# Patient Record
Sex: Female | Born: 1955 | Race: White | Smoking: Former smoker
Health system: Southern US, Community
[De-identification: ages and names within clinical notes are randomized; demographics above are authoritative.]

## PROBLEM LIST (undated history)

## (undated) DIAGNOSIS — Z8679 Personal history of other diseases of the circulatory system: Secondary | ICD-10-CM

## (undated) DIAGNOSIS — E079 Disorder of thyroid, unspecified: Secondary | ICD-10-CM

## (undated) DIAGNOSIS — F32A Depression, unspecified: Secondary | ICD-10-CM

## (undated) DIAGNOSIS — F329 Major depressive disorder, single episode, unspecified: Secondary | ICD-10-CM

## (undated) DIAGNOSIS — I10 Essential (primary) hypertension: Secondary | ICD-10-CM

## (undated) HISTORY — DX: Depression, unspecified: F32.A

## (undated) HISTORY — PX: LUMBAR EPIDURAL INJECTION: SHX1980

## (undated) HISTORY — DX: Disorder of thyroid, unspecified: E07.9

## (undated) HISTORY — DX: Personal history of other diseases of the circulatory system: Z86.79

## (undated) HISTORY — DX: Essential (primary) hypertension: I10

## (undated) HISTORY — DX: Major depressive disorder, single episode, unspecified: F32.9

---

## 2012-03-23 ENCOUNTER — Other Ambulatory Visit: Payer: Self-pay | Admitting: Orthopedic Surgery

## 2012-03-23 DIAGNOSIS — M545 Low back pain: Secondary | ICD-10-CM

## 2012-03-29 ENCOUNTER — Ambulatory Visit
Admission: RE | Admit: 2012-03-29 | Discharge: 2012-03-29 | Disposition: A | Discharge status: Home / Self Care | Payer: 59 | Source: Ambulatory Visit | Attending: Orthopedic Surgery | Admitting: Orthopedic Surgery

## 2012-03-29 DIAGNOSIS — M545 Low back pain: Secondary | ICD-10-CM

## 2012-03-30 ENCOUNTER — Other Ambulatory Visit: Payer: Self-pay

## 2012-08-07 ENCOUNTER — Ambulatory Visit: Payer: 59

## 2012-08-07 ENCOUNTER — Ambulatory Visit (INDEPENDENT_AMBULATORY_CARE_PROVIDER_SITE_OTHER): Payer: 59 | Admitting: Emergency Medicine

## 2012-08-07 VITALS — BP 135/83 | HR 86 | Temp 98.8°F | Resp 16 | Ht 66.0 in | Wt 239.0 lb

## 2012-08-07 DIAGNOSIS — M545 Low back pain, unspecified: Secondary | ICD-10-CM | POA: Insufficient documentation

## 2012-08-07 DIAGNOSIS — E0530 Thyrotoxicosis from ectopic thyroid tissue without thyrotoxic crisis or storm: Secondary | ICD-10-CM

## 2012-08-07 DIAGNOSIS — J40 Bronchitis, not specified as acute or chronic: Secondary | ICD-10-CM

## 2012-08-07 DIAGNOSIS — R05 Cough: Secondary | ICD-10-CM

## 2012-08-07 DIAGNOSIS — Z87891 Personal history of nicotine dependence: Secondary | ICD-10-CM

## 2012-08-07 DIAGNOSIS — J029 Acute pharyngitis, unspecified: Secondary | ICD-10-CM

## 2012-08-07 DIAGNOSIS — E039 Hypothyroidism, unspecified: Secondary | ICD-10-CM

## 2012-08-07 LAB — POCT CBC
Granulocyte percent: 84.9 %G — AB (ref 37–80)
HCT, POC: 48.9 % — AB (ref 37.7–47.9)
MCH, POC: 29.4 pg (ref 27–31.2)
MCV: 93.9 fL (ref 80–97)
MID (cbc): 0.7 (ref 0–0.9)
POC LYMPH PERCENT: 10 %L (ref 10–50)
Platelet Count, POC: 356 10*3/uL (ref 142–424)
RBC: 5.21 M/uL (ref 4.04–5.48)
RDW, POC: 12.9 %
WBC: 14.4 10*3/uL — AB (ref 4.6–10.2)

## 2012-08-07 MED ORDER — LEVOFLOXACIN 500 MG PO TABS: 500.0000 mg | ORAL_TABLET | Freq: Every day | ORAL | Status: AC

## 2012-08-07 NOTE — Progress Notes (Signed)
  Subjective:    Patient ID: Crystal Garner, female    DOB: 02-Dec-1955, 56 y.o.   MRN: [id number]  Cough Associated symptoms include chills and postnasal drip. Pertinent negatives include no headaches or shortness of breath.   Patient comes into our office with complaints of Chest congestion. 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  HENT: Positive for postnasal drip.   Respiratory: Positive for cough. Negative for chest tightness and shortness of breath.   Musculoskeletal: Positive for back pain.       Due to slipped disc L4 and L5  Neurological: Negative for dizziness and headaches.       Objective:   Physical Exam HEENT exam TMs are clear nose is normal throat is slightly red. Neck is supple chest exam reveals symmetrical breath sounds there are no wheezes there are no areas of dullness to percussion cardiac exam revealed regular rate without murmurs or gallops.  Results for orders placed in visit on 08/07/12  POCT CBC      Component Value Range   WBC 14.4 (*) 4.6 - 10.2 K/uL   Lymph, poc 1.4  0.6 - 3.4   POC LYMPH PERCENT 10.0  10 - 50 %L   MID (cbc) 0.7  0 - 0.9   POC MID % 5.1  0 - 12 %M   POC Granulocyte 12.2 (*) 2 - 6.9   Granulocyte percent 84.9 (*) 37 - 80 %G   RBC 5.21  4.04 - 5.48 M/uL   Hemoglobin 15.3  12.2 - 16.2 g/dL   HCT, POC 40.9 (*) 81.1 - 47.9 %   MCV 93.9  80 - 97 fL   MCH, POC 29.4  27 - 31.2 pg   MCHC 31.3 (*) 31.8 - 35.4 g/dL   RDW, POC 91.4     Platelet Count, POC 356  142 - 424 K/uL   MPV 9.7  0 - 99.8 fL   [facility] reading (PRIMARY) by  Dr. Cleta Alberts  in markings but there are no consolidated areas no definite evidence  of pneumonia.       Assessment & Plan:  No definite pneumonia seen we'll treat with Levaquin 500 one a day she does not do well with cough medications .

## 2012-08-08 LAB — TSH: TSH: 0.57 u[IU]/mL (ref 0.350–4.500)

## 2012-08-08 MED ORDER — LEVOTHYROXINE SODIUM 150 MCG PO TABS: 150.0000 ug | ORAL_TABLET | Freq: Every day | ORAL | Status: DC

## 2012-08-08 NOTE — Addendum Note (Signed)
Addended by: Marinus Maw on: 08/08/2012 11:56 AM   Modules accepted: Orders

## 2012-12-17 ENCOUNTER — Ambulatory Visit (INDEPENDENT_AMBULATORY_CARE_PROVIDER_SITE_OTHER): Payer: 59 | Admitting: Physician Assistant

## 2012-12-17 ENCOUNTER — Telehealth: Payer: Self-pay

## 2012-12-17 VITALS — BP 136/77 | HR 58 | Temp 97.7°F | Resp 18 | Ht 65.0 in | Wt 247.0 lb

## 2012-12-17 DIAGNOSIS — H01133 Eczematous dermatitis of right eye, unspecified eyelid: Secondary | ICD-10-CM

## 2012-12-17 DIAGNOSIS — E039 Hypothyroidism, unspecified: Secondary | ICD-10-CM

## 2012-12-17 DIAGNOSIS — H01139 Eczematous dermatitis of unspecified eye, unspecified eyelid: Secondary | ICD-10-CM

## 2012-12-17 DIAGNOSIS — G43909 Migraine, unspecified, not intractable, without status migrainosus: Secondary | ICD-10-CM

## 2012-12-17 DIAGNOSIS — I10 Essential (primary) hypertension: Secondary | ICD-10-CM

## 2012-12-17 LAB — BASIC METABOLIC PANEL
BUN: 19 mg/dL (ref 6–23)
CO2: 28 mEq/L (ref 19–32)
Chloride: 100 mEq/L (ref 96–112)
Creat: 0.78 mg/dL (ref 0.50–1.10)
Glucose, Bld: 95 mg/dL (ref 70–99)
Potassium: 4.4 mEq/L (ref 3.5–5.3)

## 2012-12-17 MED ORDER — SUMATRIPTAN SUCCINATE 100 MG PO TABS: 100.0000 mg | ORAL_TABLET | ORAL | Status: DC | PRN

## 2012-12-17 MED ORDER — SUMATRIPTAN-NAPROXEN SODIUM 85-500 MG PO TABS: 1.0000 | ORAL_TABLET | ORAL | Status: DC | PRN

## 2012-12-17 MED ORDER — LISINOPRIL-HYDROCHLOROTHIAZIDE 20-12.5 MG PO TABS: ORAL_TABLET | ORAL | Status: DC

## 2012-12-17 MED ORDER — DESOXIMETASONE 0.05 % EX CREA: TOPICAL_CREAM | Freq: Two times a day (BID) | CUTANEOUS | Status: DC

## 2012-12-17 NOTE — Telephone Encounter (Signed)
Pt would like to get imitrex generic by itself possibly the 100mg  and she will get the naproxen over the counter.  The Treximet is a little expensive.  Is this possible to do this for her? She does know that the Treximet has 85mg  of imitrex in it

## 2012-12-17 NOTE — Telephone Encounter (Signed)
Pt was here recently, she is needing a change in her medication to a generic. If someone can call her back she will greatly appreciate it.

## 2012-12-17 NOTE — Progress Notes (Signed)
  Subjective:    Patient ID: Crystal Garner, female    DOB: 1956/03/06, 57 y.o.   MRN: [id number]  HPI   Crystal Garner is a 57 yr old female here for medication refills.  She recently moved from [place] to [place], has not yet established with a PCP.  Has been here one time for RF of thyroid medicine.  Now needing RFs on other medications.  Was previously a pt at [facility] in [place].  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  Also has concern for a bump on her left foot.  This has been there years.  One time it swelled up and was painful, resolved on it's own.  Not bothering her today.    Review of Systems  Constitutional: Negative for fever and chills.  HENT: Positive for congestion and rhinorrhea.   Cardiovascular: Negative.   Gastrointestinal: Negative.   Musculoskeletal: Negative.   Skin: Negative.   Neurological: Negative.        Objective:   Physical Exam  Constitutional: She is oriented to person, place, and time. She appears well-developed and well-nourished. No distress.  HENT:  Head: Normocephalic and atraumatic.  Cardiovascular: Normal rate, regular rhythm and normal heart sounds.   Pulmonary/Chest: Effort normal and breath sounds normal. She has no wheezes. She has no rales.  Musculoskeletal:       Left foot: Normal.  Neurological: She is alert and oriented to person, place, and time.  Skin: Skin is warm and dry.  Hyperpigmentation under right eye and medial aspect of eyelid  Psychiatric: She has a normal mood and affect. Her behavior is normal.     Filed Vitals:   12/17/12 1139  BP: 136/77  Pulse: 58  Temp: 97.7 F (36.5 C)  Resp:  18        Assessment & Plan:  Hypertension - Plan: Basic metabolic panel, lisinopril-hydrochlorothiazide (PRINZIDE,ZESTORETIC) 20-12.5 MG per tablet  -- BMP, refilled lisinopril/hct, will continue same dose as pt seems to have good control  Unspecified hypothyroidism - Plan: TSH  -- TSH, pt is not in need of refills at this time.  May adjust dose based on labs.  Migraine - Plan: SUMAtriptan-naproxen (TREXIMET) 85-500 MG per tablet  -- Refilled treximet  Eczematous dermatitis of eyelid, right - Plan: desoximetasone (TOPICORT) 0.05 % cream  -- Refilled topicort.  Discussed with pt my concerns with using steroid cream on the face.  Pt states she understands and would like to continue using this.  She has never been evaluated by dermatology for this.    Encouraged pt to schedule CPE sometime in the next several weeks/months to establish care and for routine health maintenance.  Pt understands and agrees.  Will follow up on labs this week and adjust meds if necessary.

## 2012-12-17 NOTE — Patient Instructions (Addendum)
I have refilled your medications today.  I will let you know when your labs are back and if we need to make any changes.  I would encourage you to schedule a Complete Physical Exam in the next several months

## 2012-12-17 NOTE — Telephone Encounter (Signed)
Sent imitrex to the pharmacy

## 2012-12-18 NOTE — Telephone Encounter (Signed)
Called patient to advise  °

## 2012-12-19 ENCOUNTER — Other Ambulatory Visit: Payer: Self-pay | Admitting: Radiology

## 2012-12-19 DIAGNOSIS — H01133 Eczematous dermatitis of right eye, unspecified eyelid: Secondary | ICD-10-CM

## 2012-12-19 MED ORDER — DESOXIMETASONE 0.05 % EX CREA: TOPICAL_CREAM | Freq: Two times a day (BID) | CUTANEOUS | Status: DC

## 2012-12-19 NOTE — Telephone Encounter (Signed)
Changed to 60 gm tube, this is available at pharmacy, but 30g tube not.

## 2012-12-26 ENCOUNTER — Ambulatory Visit (INDEPENDENT_AMBULATORY_CARE_PROVIDER_SITE_OTHER): Payer: 59 | Admitting: Emergency Medicine

## 2012-12-26 VITALS — BP 122/79 | HR 69 | Temp 97.8°F | Resp 16 | Ht 65.0 in | Wt 245.0 lb

## 2012-12-26 DIAGNOSIS — J209 Acute bronchitis, unspecified: Secondary | ICD-10-CM

## 2012-12-26 MED ORDER — PSEUDOEPHEDRINE-GUAIFENESIN ER 60-600 MG PO TB12: 1.0000 | ORAL_TABLET | Freq: Two times a day (BID) | ORAL | Status: DC

## 2012-12-26 MED ORDER — AZITHROMYCIN 250 MG PO TABS: ORAL_TABLET | ORAL | Status: DC

## 2012-12-26 MED ORDER — HYDROCOD POLST-CHLORPHEN POLST 10-8 MG/5ML PO LQCR: 5.0000 mL | Freq: Two times a day (BID) | ORAL | Status: DC | PRN

## 2012-12-26 NOTE — Patient Instructions (Signed)

## 2012-12-26 NOTE — Progress Notes (Signed)
[facility] [address] [place] [address] [phone]  Date:  12/26/2012   Name:  Crystal Garner   DOB:  1955/12/11   MRN:  [id number]  PCP:  No primary provider on file.    Chief Complaint: URI and Cough   History of Present Illness:  Aliyyah Riese is a 57 y.o. very pleasant female patient who presents with the following:  Ill for past 3-4 days with a productive cough and nasal congestion with no discharge. Has a post nasal drip. Cough is worse at night. No wheezing or shortness of breath.  No nausea or vomiting.  No stool change.  No rash.  No improvement with over the counter medications or other home remedies. Denies other complaint or health concern today.  Patient Active Problem List  Diagnosis  . LBP (low back pain)  . Hypothyroid  . Former smoker    Past Medical History  Diagnosis Date  . Depression   . Thyroid disease   . Hypertension     No past surgical history on file.  History  Substance Use Topics  . Smoking status: Former Games developer  . Smokeless tobacco: Not on file  . Alcohol Use: 0.0 oz/week    3-4 Glasses of wine per week    Family History  Problem Relation Age of Onset  . Alzheimer's disease Mother   . Diabetes Father   . Arthritis Father   . Multiple sclerosis Sister   . Heart disease Brother   . Diabetes Maternal Grandmother   . Heart disease Maternal Grandmother   . Heart disease Maternal Grandfather     Allergies  Allergen Reactions  . Shellfish Allergy Anaphylaxis and Rash  . Amoxicillin   . Codeine     Medication list has been reviewed and updated.  Current Outpatient Prescriptions on File Prior to Visit  Medication Sig Dispense Refill  . cholecalciferol (VITAMIN D) 1000 UNITS tablet Take 1,000 Units by mouth 3 (three) times a week.      . desoximetasone (TOPICORT) 0.05 % cream Apply topically 2 (two) times daily.  60 g  0  . levothyroxine (SYNTHROID, LEVOTHROID) 150 MCG tablet Take 1 tablet (150 mcg  total) by mouth daily.  30 tablet  11  . lisinopril-hydrochlorothiazide (PRINZIDE,ZESTORETIC) 20-12.5 MG per tablet Take 1/4 tablet by mouth daily  45 tablet  0  . SUMAtriptan (IMITREX) 100 MG tablet Take 1 tablet (100 mg total) by mouth every 2 (two) hours as needed for migraine.  10 tablet  0   No current facility-administered medications on file prior to visit.    Review of Systems:  As per HPI, otherwise negative.    Physical Examination: Filed Vitals:   12/26/12 1300  BP: 122/79  Pulse: 69  Temp: 97.8 F (36.6 C)  Resp: 16   Filed Vitals:   12/26/12 1300  Height: 5\' 5"  (1.651 m)  Weight: 245 lb (111.131 kg)   Body mass index is 40.77 kg/(m^2). Ideal Body Weight: Weight in (lb) to have BMI = 25: 149.9  GEN: WDWN, NAD, Non-toxic, A & O x 3 HEENT: Atraumatic, Normocephalic. Neck supple. No masses, No LAD. Ears and Nose: No external deformity. CV: RRR, No M/G/R. No JVD. No thrill. No extra heart sounds. PULM: CTA B, no wheezes, crackles, rhonchi. No retractions. No resp. distress. No accessory muscle use. ABD: S, NT, ND, +BS. No rebound. No HSM. EXTR: No c/c/e NEURO Normal gait.  PSYCH: Normally interactive. Conversant. Not depressed or  anxious appearing.  Calm demeanor.    Assessment and Plan: Bronchitis zpak tussionex mucinex d   Signed,  Phillips Odor, MD

## 2012-12-27 NOTE — Progress Notes (Signed)
Reviewed and agree.

## 2013-01-18 ENCOUNTER — Other Ambulatory Visit: Payer: Self-pay | Admitting: Physician Assistant

## 2013-06-17 ENCOUNTER — Other Ambulatory Visit: Payer: Self-pay | Admitting: Physician Assistant

## 2013-08-17 ENCOUNTER — Ambulatory Visit (INDEPENDENT_AMBULATORY_CARE_PROVIDER_SITE_OTHER): Payer: 59 | Admitting: Family Medicine

## 2013-08-17 ENCOUNTER — Encounter: Payer: Self-pay | Admitting: Family Medicine

## 2013-08-17 VITALS — BP 140/92 | HR 60 | Temp 97.8°F | Resp 16 | Ht 66.5 in | Wt 254.0 lb

## 2013-08-17 DIAGNOSIS — E039 Hypothyroidism, unspecified: Secondary | ICD-10-CM

## 2013-08-17 DIAGNOSIS — R609 Edema, unspecified: Secondary | ICD-10-CM

## 2013-08-17 DIAGNOSIS — M2550 Pain in unspecified joint: Secondary | ICD-10-CM

## 2013-08-17 DIAGNOSIS — R600 Localized edema: Secondary | ICD-10-CM

## 2013-08-17 DIAGNOSIS — M545 Low back pain: Secondary | ICD-10-CM

## 2013-08-17 DIAGNOSIS — I10 Essential (primary) hypertension: Secondary | ICD-10-CM

## 2013-08-17 LAB — CBC WITH DIFFERENTIAL/PLATELET
Basophils Relative: 1 % (ref 0–1)
HCT: 42.9 % (ref 36.0–46.0)
Hemoglobin: 14.8 g/dL (ref 12.0–15.0)
Lymphs Abs: 2.1 10*3/uL (ref 0.7–4.0)
MCHC: 34.5 g/dL (ref 30.0–36.0)
Monocytes Absolute: 0.7 10*3/uL (ref 0.1–1.0)
Monocytes Relative: 8 % (ref 3–12)
Neutro Abs: 5.3 10*3/uL (ref 1.7–7.7)
Neutrophils Relative %: 63 % (ref 43–77)
RBC: 5.04 MIL/uL (ref 3.87–5.11)

## 2013-08-17 LAB — COMPREHENSIVE METABOLIC PANEL
ALT: 16 U/L (ref 0–35)
Albumin: 4.4 g/dL (ref 3.5–5.2)
Alkaline Phosphatase: 60 U/L (ref 39–117)
CO2: 25 mEq/L (ref 19–32)
Creat: 0.7 mg/dL (ref 0.50–1.10)
Glucose, Bld: 89 mg/dL (ref 70–99)
Potassium: 4.1 mEq/L (ref 3.5–5.3)
Sodium: 138 mEq/L (ref 135–145)
Total Bilirubin: 0.6 mg/dL (ref 0.3–1.2)
Total Protein: 7.4 g/dL (ref 6.0–8.3)

## 2013-08-17 LAB — LIPID PANEL
Cholesterol: 286 mg/dL — ABNORMAL HIGH (ref 0–200)
HDL: 62 mg/dL (ref >39)
LDL Cholesterol: 171 mg/dL — ABNORMAL HIGH (ref 0–99)
Total CHOL/HDL Ratio: 4.6 Ratio
Triglycerides: 266 mg/dL — ABNORMAL HIGH (ref <150)
VLDL: 53 mg/dL — ABNORMAL HIGH (ref 0–40)

## 2013-08-17 LAB — TSH: TSH: 1.806 u[IU]/mL (ref 0.350–4.500)

## 2013-08-17 MED ORDER — SUMATRIPTAN SUCCINATE 100 MG PO TABS: ORAL_TABLET | ORAL | Status: DC

## 2013-08-17 MED ORDER — HYDROCHLOROTHIAZIDE 25 MG PO TABS: 25.0000 mg | ORAL_TABLET | Freq: Every day | ORAL | Status: DC

## 2013-08-17 NOTE — Progress Notes (Addendum)
Subjective:    Patient ID: Crystal Garner, female    DOB: Mar 07, 1956, 57 y.o.   MRN: [id number] Chief Complaint  Patient presents with  . Medication Refill    all meds  . Foot Swelling    HPI  Taking vitamin D 1000u every other day.  Does have a home BP machine but has not used in a while.  Has had several presyncopal episodes w/ hypotension which is why she has it but no episodes in over a year. Unknown etiology of prior episodic hypotension.   Has been on 1/4 tab of her BP medication for many years - was initially started for migraine prophylaxis at [facility] and never changed.  Is not fasting today - has not eaten at all but had cream in her coffee. Does not want to start a statin no matter what.  Has been seeing orthopedist at [facility] for the past year - Dr. Raeanne Barry - and just finished 6 wks of PT. Is less active due to back pain. Due to back pain she sits a lot.  Has had episodes over the years of a single joint being very painful for sev wks and then will go away - seems to be migratory. Had neg arthritis panel but has never been evaluated during an episode of joint pain.  Has gained a lot of weight in the past year. Knows she needs to loose it but difficult  Migraines infrequent but still happen occasionally.   For that past sev mos her feet and ankles will swell up intermittently. Has had left chest tightness and squeezing sensation in the past - not in quite a while.  No ShoB, DOE, orthopnea, or PND. Father had CHF so she was worried about that.  Feels like father did much worse on his medication - thinks the medication is horrible and that it caused the end of his life to be miserable - that is why she never wants to be on a statin. Wants to minimize medications and medical interventions if able.  Is a vegetarian - tries to do a lot of tofu, beans, peas, nuts for protein sources. Occ fish but shellfish allergy.  Sugar makes her gain a lot of weight so tries to stay away  from fruit.  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 Does not want to get today.  Allergic to the adhesive used to do EKGs.  Just figured it out last year. After an EKG would have weeks of horrible itching. The hospital told her there was an alternative way to have the performed other than w/ the adhesive.   Review of Systems  Constitutional: Positive for activity change, fatigue and unexpected weight change. Negative for fever, chills, diaphoresis and appetite change.  Eyes: Negative for visual disturbance.  Respiratory: Positive for chest tightness. Negative for cough and shortness of breath.   Cardiovascular: Positive for leg swelling. Negative for chest pain and palpitations.  Genitourinary: Positive for difficulty urinating. Negative for dysuria, frequency and decreased urine volume.  Neurological: Negative for dizziness, syncope, light-headedness and headaches.  Hematological: Does not bruise/bleed easily.      BP 140/92  Pulse 60  Temp(Src) 97.8 F (36.6 C) (Oral)  Resp 16  Ht 5' 6.5" (1.689 m)  Wt 254 lb (115.214 kg)  BMI 40.39 kg/m2  SpO2 98% Objective:   Physical Exam  Constitutional: She is oriented to person, place, and time. She appears well-developed and well-nourished. No distress.  HENT:  Head: Normocephalic and atraumatic.  Right Ear: External ear normal.  Left Ear: External ear normal.  Eyes: Conjunctivae are normal. No scleral icterus.  Neck: Normal range of motion. Neck supple. No thyromegaly present.  Cardiovascular: Normal rate, regular rhythm, normal heart sounds and intact distal pulses.   2+ bilaterally pitting edema to mid-shin  Pulmonary/Chest: Effort normal and breath sounds normal. No respiratory distress.  Musculoskeletal: She exhibits edema.  Lymphadenopathy:    She has no cervical adenopathy.  Neurological: She is alert and oriented to person, place, and time.  Skin: Skin is warm and dry. She is not diaphoretic.  No erythema.  Psychiatric: She has a normal mood and affect. Her behavior is normal.  Pt tearful when talking about father and how horrible the medications for CHF made him feel.       EKG: pt refused. Assessment & Plan:  Hypothyroid - Plan: TSH - Refill levothyroxine after tsh comes back.  TSH nml so refilled x 1 yr.  LBP (low back pain) - followed by Dr. Raeanne Barry at [facility], completed PT.  Arthralgia - Plan: Vit D  25 hydroxy (rtn osteoporosis monitoring) - on otc 1000u qod.  Essential hypertension, benign - Plan: CBC with Differential, Lipid panel, Comprehensive metabolic panel - pt was on 1/4 tab lisinopril-hctz 20/12.5 (so on 5/3.125). Since she is having more pitting dependent lower ext edema will stop this med and just start on hctz 25 qd. Discussed high potassium diet (pt doesn't like to eat fruit though as it is just sugar which makes her fat).  Pedal edema - attributed to weight gain and sedentary life style (due to back and joint pains). No concern for CHF at this point as does go away at times and no other sxs. However, pt has had some episodes of left-sided chest tightness and has a +FHx of CAD/CHF so encouraged pt to get baseline EKG and cardiac eval for exercise stress test - pt will consider - will try to get EKG done somewhere where they don't use adhesive.  HM - pt recommended to get TDaP. Rec f/u OV for CPE.  Migraines - If recur when changing BP medication, can always add back in acei (though unusual for migraine prophylaxis so do not expect this.)  Meds ordered this encounter  Medications  . SUMAtriptan (IMITREX) 100 MG tablet    Sig: TAKE ONE TABLET BY MOUTH EVERY 2 HOURS AS NEEDED FOR MIGRAINE    Dispense:  10 tablet    Refill:  11  . hydrochlorothiazide (HYDRODIURIL) 25 MG tablet    Sig: Take 1 tablet (25 mg total) by mouth daily.    Dispense:  90 tablet    Refill:  1   Over 40 minutes spent in face-to-face eval of pt.  Norberto Sorenson, MD MPH

## 2013-08-17 NOTE — Patient Instructions (Addendum)
Start checking your blood pressure at home. You want your blood pressure to be in the 110s-120s/70-80.  If occasionally in the 130s that is ok but if it is up to 140s that is to high and we would want to increase your blood pressure medication.  If you start getting more migraines we may want to switch you back or try a different BP medication. I would recommend getting a stress exercise test since you have had some chest pain in the past and you have the risk factors of family history, age, elevated blood pressure, and weight.  If you would like a referral to a cardiologist for this let me know.  I would also recommend getting a baseline EKG into the system. If you ever have an episode of chest pain we need a baseline - when you are healthy and pain free - to compare your EKG during the chest pain too so that we can tell if the pain is coming from your heart. You may call the hospital or the cardiology offices in town to see if they can do an EKG w/ a different adhesive - we are all on the same computer system so would have access to this. You are due for a tetanus shot - plan to get this at your next OV. DASH Diet The DASH diet stands for "Dietary Approaches to Stop Hypertension." It is a healthy eating plan that has been shown to reduce high blood pressure (hypertension) in as little as 14 days, while also possibly providing other significant health benefits. These other health benefits include reducing the risk of breast cancer after menopause and reducing the risk of type 2 diabetes, heart disease, colon cancer, and stroke. Health benefits also include weight loss and slowing kidney failure in patients with chronic kidney disease.  DIET GUIDELINES  Limit salt (sodium). Your diet should contain less than 1500 mg of sodium daily.  Limit refined or processed carbohydrates. Your diet should include mostly whole grains. Desserts and added sugars should be used sparingly.  Include small amounts of  heart-healthy fats. These types of fats include nuts, oils, and tub margarine. Limit saturated and trans fats. These fats have been shown to be harmful in the body. CHOOSING FOODS  The following food groups are based on a 2000 calorie diet. See your Registered Dietitian for individual calorie needs. Grains and Grain Products (6 to 8 servings daily)  Eat More Often: Whole-wheat bread, brown rice, whole-grain or wheat pasta, quinoa, popcorn without added fat or salt (air popped).  Eat Less Often: White bread, white pasta, white rice, cornbread. Vegetables (4 to 5 servings daily)  Eat More Often: Fresh, frozen, and canned vegetables. Vegetables may be raw, steamed, roasted, or grilled with a minimal amount of fat.  Eat Less Often/Avoid: Creamed or fried vegetables. Vegetables in a cheese sauce. Fruit (4 to 5 servings daily)  Eat More Often: All fresh, canned (in natural juice), or frozen fruits. Dried fruits without added sugar. One hundred percent fruit juice ( cup [237 mL] daily).  Eat Less Often: Dried fruits with added sugar. Canned fruit in light or heavy syrup. [employer], Fish, and Poultry (2 servings or less daily. One serving is 3 to 4 oz [85-114 g]).  Eat More Often: Ninety percent or leaner ground beef, tenderloin, sirloin. Round cuts of beef, chicken breast, [place] breast. All fish. Grill, bake, or broil your meat. Nothing should be fried.  Eat Less Often/Avoid: Fatty cuts of meat, [place], or chicken  leg, thigh, or wing. Fried cuts of meat or fish. Dairy (2 to 3 servings)  Eat More Often: Low-fat or fat-free milk, low-fat plain or light yogurt, reduced-fat or part-skim cheese.  Eat Less Often/Avoid: Milk (whole, 2%).Whole milk yogurt. Full-fat cheeses. Nuts, Seeds, and Legumes (4 to 5 servings per week)  Eat More Often: All without added salt.  Eat Less Often/Avoid: Salted nuts and seeds, canned beans with added salt. Fats and Sweets (limited)  Eat More Often: Vegetable  oils, tub margarines without trans fats, sugar-free gelatin. Mayonnaise and salad dressings.  Eat Less Often/Avoid: Coconut oils, palm oils, butter, stick margarine, cream, half and half, cookies, candy, pie. FOR MORE INFORMATION The Dash Diet Eating Plan: www.dashdiet.org Document Released: 08/19/2011 Document Revised: 11/22/2011 Document Reviewed: 08/19/2011 [facility] Patient Information 2014 [place], [place]. Potassium Content of Foods Potassium is a mineral found in many foods and drinks. It helps keep fluids and minerals balanced in your body and also affects how steadily your heart beats. The body needs potassium to control blood pressure and to keep the muscles and nervous system healthy. However, certain health conditions and medicine may require you to eat more or less potassium-rich foods and drinks. Your caregiver or dietitian will tell you how much potassium you should have each day. COMMON SERVING SIZES The list below tells you how big or small common portion sizes are:  1 oz.........4 stacked dice.  3 oz........Marland KitchenDeck of cards.  1 tsp.......Marland KitchenTip of little finger.  1 tbsp....Marland KitchenMarland KitchenThumb.  2 tbsp....Marland KitchenMarland KitchenGolf ball.   c..........Marland KitchenHalf of a fist.  1 c...........Marland KitchenA fist. FOODS AND DRINKS HIGH IN POTASSIUM More than 200 mg of potassium per serving. A serving size is  c (120 mL or noted gram weight) unless otherwise stated. While all the items on this list are high in potassium, some items are higher in potassium than others. Fruits  Apricots (sliced), 83 g.  Apricots (dried halves), 3 oz / 24 g.  Avocado (cubed),  c / 50 g.  Banana (sliced), 75 g.  Cantaloupe (cubed), 80 g.  Dates (pitted), 5 whole / 35 g.  Figs (dried), 4 whole / 32 g.  Guava, c / 55 g.  Honeydew, 1 wedge / 85 g.  Kiwi (sliced), 90 g.  Nectarine, 1 small / 129 g.  Orange, 1 medium / 131 g.  Orange juice.  Pomegranate seeds, 87 g.  Pomegranate juice.  Prunes (pitted), 3 whole / 30  g.  Prune juice, 3 oz / 90 mL.  Seedless raisins, 3 tbsp / 27 g. Vegetables  Artichoke,  of a medium / 64 g.  Asparagus (boiled), 90 g.  Baked beans,  c / 63 g.  Bamboo shoots,  c / 38 g.  Beets (cooked slices), 85 g.  Broccoli (boiled), 78 g.  Brussels sprout (boiled), 78 g.  Butternut squash (baked), 103 g.  Chickpea (cooked), 82 g.  Green peas (cooked), 80 g.  Hubbard squash (baked cubes),  c / 68 g.  Kidney beans (cooked), 5 tbsp / 55 g.  Lima beans (cooked),  c / 43 g.  Navy beans (cooked),  c / 61 g.  Potato (baked), 61 g.  Potato (boiled), 78 g.  Pumpkin (boiled), 123 g.  Refried beans,  c / 79 g.  Spinach (cooked),  c / 45 g.  Split peas (cooked),  c / 65 g.  Sun-dried tomatoes, 2 tbsp / 7 g.  Sweet potato (baked),  c / 50 g.  Tomato (chopped or sliced), 90 g.  Tomato juice.  Tomato paste, 4 tsp / 21 g.  Tomato sauce,  c / 61 g.  Vegetable juice.  White mushrooms (cooked), 78 g.  Yam (cooked or baked),  c / 34 g.  Zucchini squash (boiled), 90 g. Other Foods and Drinks  Almonds (whole),  c / 36 g.  Cashews (oil roasted),  c / 32 g.  Chocolate milk.  Chocolate pudding, 142 g.  Clams (steamed), 1.5 oz / 43 g.  Dark chocolate, 1.5 oz / 42 g.  Fish, 3 oz / 85 g.  King crab (steamed), 3 oz / 85 g.  Lobster (steamed), 4 oz / 113 g.  Milk (skim, 1%, 2%, whole), 1 c / 240 mL.  Milk chocolate, 2.3 oz / 66 g.  Milk shake.  Nonfat fruit variety yogurt, 123 g.  Peanuts (oil roasted), 1 oz / 28 g.  Peanut butter, 2 tbsp / 32 g.  Pistachio nuts, 1 oz / 28 g.  Pumpkin seeds, 1 oz / 28 g.  Red meat (broiled, cooked, grilled), 3 oz / 85 g.  Scallops (steamed), 3 oz / 85 g.  Shredded wheat cereal (dry), 3 oblong biscuits / 75 g.  Spaghetti sauce,  c / 66 g.  Sunflower seeds (dry roasted), 1 oz / 28 g.  Veggie burger, 1 patty / 70 g. FOODS MODERATE IN POTASSIUM Between 150 mg and 200 mg per serving. A  serving is  c (120 mL or noted gram weight) unless otherwise stated. Fruits  Grapefruit,  of the fruit / 123 g.  Grapefruit juice.  Pineapple juice.  Plums (sliced), 83 g.  Tangerine, 1 large / 120 g. Vegetables  Carrots (boiled), 78 g.  Carrots (sliced), 61 g.  Rhubarb (cooked with sugar), 120 g.  [place] (cooked), 120 g.  Sweet corn (cooked), 75 g.  Yellow snap beans (cooked), 63 g. Other Foods and Drinks   Bagel, 1 bagel / 98 g.  Chicken breast (roasted and chopped),  c / 70 g.  Chocolate ice cream / 66 g.  Pita bread, 1 large / 64 g.  Shrimp (steamed), 4 oz / 113 g.  Swiss cheese (diced), 70 g.  Vanilla ice cream, 66 g.  Vanilla pudding, 140 g. FOODS LOW IN POTASSIUM Less than 150 mg per serving. A serving size is  cup (120 mL or noted gram weight) unless otherwise stated. If you eat more than 1 serving of a food low in potassium, the food may be considered a food high in potassium. Fruits  Apple (slices), 55 g.  Apple juice.  Applesauce, 122 g.  Blackberries, 72 g.  Blueberries, 74 g.  Cranberries, 50 g.  Cranberry juice.  Fruit cocktail, 119 g.  Fruit punch.  Grapes, 46 g.  Grape juice.  Mandarin oranges (canned), 126 g.  Peach (slices), 77 g.  Pineapple (chunks), 83 g.  Raspberries, 62 g.  Red cherries (without pits), 78 g.  Strawberries (sliced), 83 g.  Watermelon (diced), 76 g. Vegetables  Alfalfa sprouts, 17 g.  Bell peppers (sliced), 46 g.  Cabbage (shredded), 35 g.  Cauliflower (boiled), 62 g.  Celery, 51 g.  Collard greens (boiled), 95 g.  Cucumber (sliced), 52 g.  Eggplant (cubed), 41 g.  Green beans (boiled), 63 g.  Lettuce (shredded), 1 c / 36 g.  Onions (sauteed), 44 g.  Radishes (sliced), 58 g.  Spaghetti squash, 51 g. Other Foods and Drinks  [employer], 1 slice / 28 g.  Black tea.  Brown rice (cooked), 98 g.  Butter croissant, 1 medium / 57 g.  Carbonated  soda.  Coffee.  Cheddar cheese (diced), 66 g.  Corn flake cereal (dry), 14 g.  Cottage cheese, 118 g.  Cream of rice cereal (cooked), 122 g.  Cream of wheat cereal (cooked), 126 g.  Crisped rice cereal (dry), 14 g.  Egg (boiled, fried, poached, omelet, scrambled), 1 large / 46 61 g.  English muffin, 1 muffin / 57 g.  Frozen ice pop, 1 pop / 55 g.  Graham cracker, 1 large rectangular cracker / 14 g.  Jelly beans, 112 g.  Non-dairy whipped topping.  Oatmeal, 88 g.  Orange sherbet, 74 g.  Puffed rice cereal (dry), 7 g.  Pasta (cooked), 70 g.  Rice cakes, 4 cakes / 36 g.  Sugared doughnut, 4 oz / 116 g.  White bread, 1 slice / 30 g.  White rice (cooked), 79 93 g.  Wild rice (cooked), 82 g.  Yellow cake, 1 slice / 68 g. Document Released: 04/13/2005 Document Revised: 08/16/2012 Document Reviewed: 01/14/2012 [facility] Patient Information 2014 [place], [place]. Peripheral Edema You have swelling in your legs (peripheral edema). This swelling is due to excess accumulation of salt and water in your body. Edema may be a sign of heart, kidney or liver disease, or a side effect of a medication. It may also be due to problems in the leg veins. Elevating your legs and using special support stockings may be very helpful, if the cause of the swelling is due to poor venous circulation. Avoid long periods of standing, whatever the cause. Treatment of edema depends on identifying the cause. Chips, pretzels, pickles and other salty foods should be avoided. Restricting salt in your diet is almost always needed. Water pills (diuretics) are often used to remove the excess salt and water from your body via urine. These medicines prevent the kidney from reabsorbing sodium. This increases urine flow. Diuretic treatment may also result in lowering of potassium levels in your body. Potassium supplements may be needed if you have to use diuretics daily. Daily weights can help you keep track of  your progress in clearing your edema. You should call your caregiver for follow up care as recommended. SEEK IMMEDIATE MEDICAL CARE IF:   You have increased swelling, pain, redness, or heat in your legs.  You develop shortness of breath, especially when lying down.  You develop chest or abdominal pain, weakness, or fainting.  You have a fever. Document Released: 10/07/2004 Document Revised: 11/22/2011 Document Reviewed: 09/17/2009 [facility] Patient Information 2014 [place], [place]. Edema Edema is an abnormal build-up of fluids in tissues. Because this is partly dependent on gravity (water flows to the lowest place), it is more common in the legs and thighs (lower extremities). It is also common in the looser tissues, like around the eyes. Painless swelling of the feet and ankles is common and increases as a person ages. It may affect both legs and may include the calves or even thighs. When squeezed, the fluid may move out of the affected area and may leave a dent for a few moments. CAUSES   Prolonged standing or sitting in one place for extended periods of time. Movement helps pump tissue fluid into the veins, and absence of movement prevents this, resulting in edema.  Varicose veins. The valves in the veins do not work as well as they should. This causes fluid to leak into the tissues.  Fluid and salt overload.  Injury, burn, or  surgery to the leg, ankle, or foot, may damage veins and allow fluid to leak out.  Sunburn damages vessels. Leaky vessels allow fluid to go out into the sunburned tissues.  Allergies (from insect bites or stings, medications or chemicals) cause swelling by allowing vessels to become leaky.  Protein in the blood helps keep fluid in your vessels. Low protein, as in malnutrition, allows fluid to leak out.  Hormonal changes, including pregnancy and menstruation, cause fluid retention. This fluid may leak out of vessels and cause edema.  Medications that cause  fluid retention. Examples are sex hormones, blood pressure medications, steroid treatment, or anti-depressants.  Some illnesses cause edema, especially heart failure, kidney disease, or liver disease.  Surgery that cuts veins or lymph nodes, such as surgery done for the heart or for breast cancer, may result in edema. DIAGNOSIS  Your caregiver is usually easily able to determine what is causing your swelling (edema) by simply asking what is wrong (getting a history) and examining you (doing a physical). Sometimes x-rays, EKG (electrocardiogram or heart tracing), and blood work may be done to evaluate for underlying medical illness. TREATMENT  General treatment includes:  Leg elevation (or elevation of the affected body part).  Restriction of fluid intake.  Prevention of fluid overload.  Compression of the affected body part. Compression with elastic bandages or support stockings squeezes the tissues, preventing fluid from entering and forcing it back into the blood vessels.  Diuretics (also called water pills or fluid pills) pull fluid out of your body in the form of increased urination. These are effective in reducing the swelling, but can have side effects and must be used only under your caregiver's supervision. Diuretics are appropriate only for some types of edema. The specific treatment can be directed at any underlying causes discovered. Heart, liver, or kidney disease should be treated appropriately. HOME CARE INSTRUCTIONS   Elevate the legs (or affected body part) above the level of the heart, while lying down.  Avoid sitting or standing still for prolonged periods of time.  Avoid putting anything directly under the knees when lying down, and do not wear constricting clothing or garters on the upper legs.  Exercising the legs causes the fluid to work back into the veins and lymphatic channels. This may help the swelling go down.  The pressure applied by elastic bandages or  support stockings can help reduce ankle swelling.  A low-salt diet may help reduce fluid retention and decrease the ankle swelling.  Take any medications exactly as prescribed. SEEK MEDICAL CARE IF:  Your edema is not responding to recommended treatments. SEEK IMMEDIATE MEDICAL CARE IF:   You develop shortness of breath or chest pain.  You cannot breathe when you lay down; or if, while lying down, you have to get up and go to the window to get your breath.  You are having increasing swelling without relief from treatment.  You develop a fever over 102 F (38.9 C).  You develop pain or redness in the areas that are swollen.  Tell your caregiver right away if you have gained 03 lb/1.4 kg in 1 day or 05 lb/2.3 kg in a week. MAKE SURE YOU:   Understand these instructions.  Will watch your condition.  Will get help right away if you are not doing well or get worse. Document Released: 08/30/2005 Document Revised: 02/29/2012 Document Reviewed: 04/17/2008 [facility] Patient Information 2014 [place], [place].

## 2013-08-30 IMAGING — CR DG CHEST 2V
2 series · 2 of 2 positions shown · non-contrast
Comparison: 03/25/2009.

CLINICAL DATA: Shortness of breath.

CHEST - 2 VIEW

[PA]
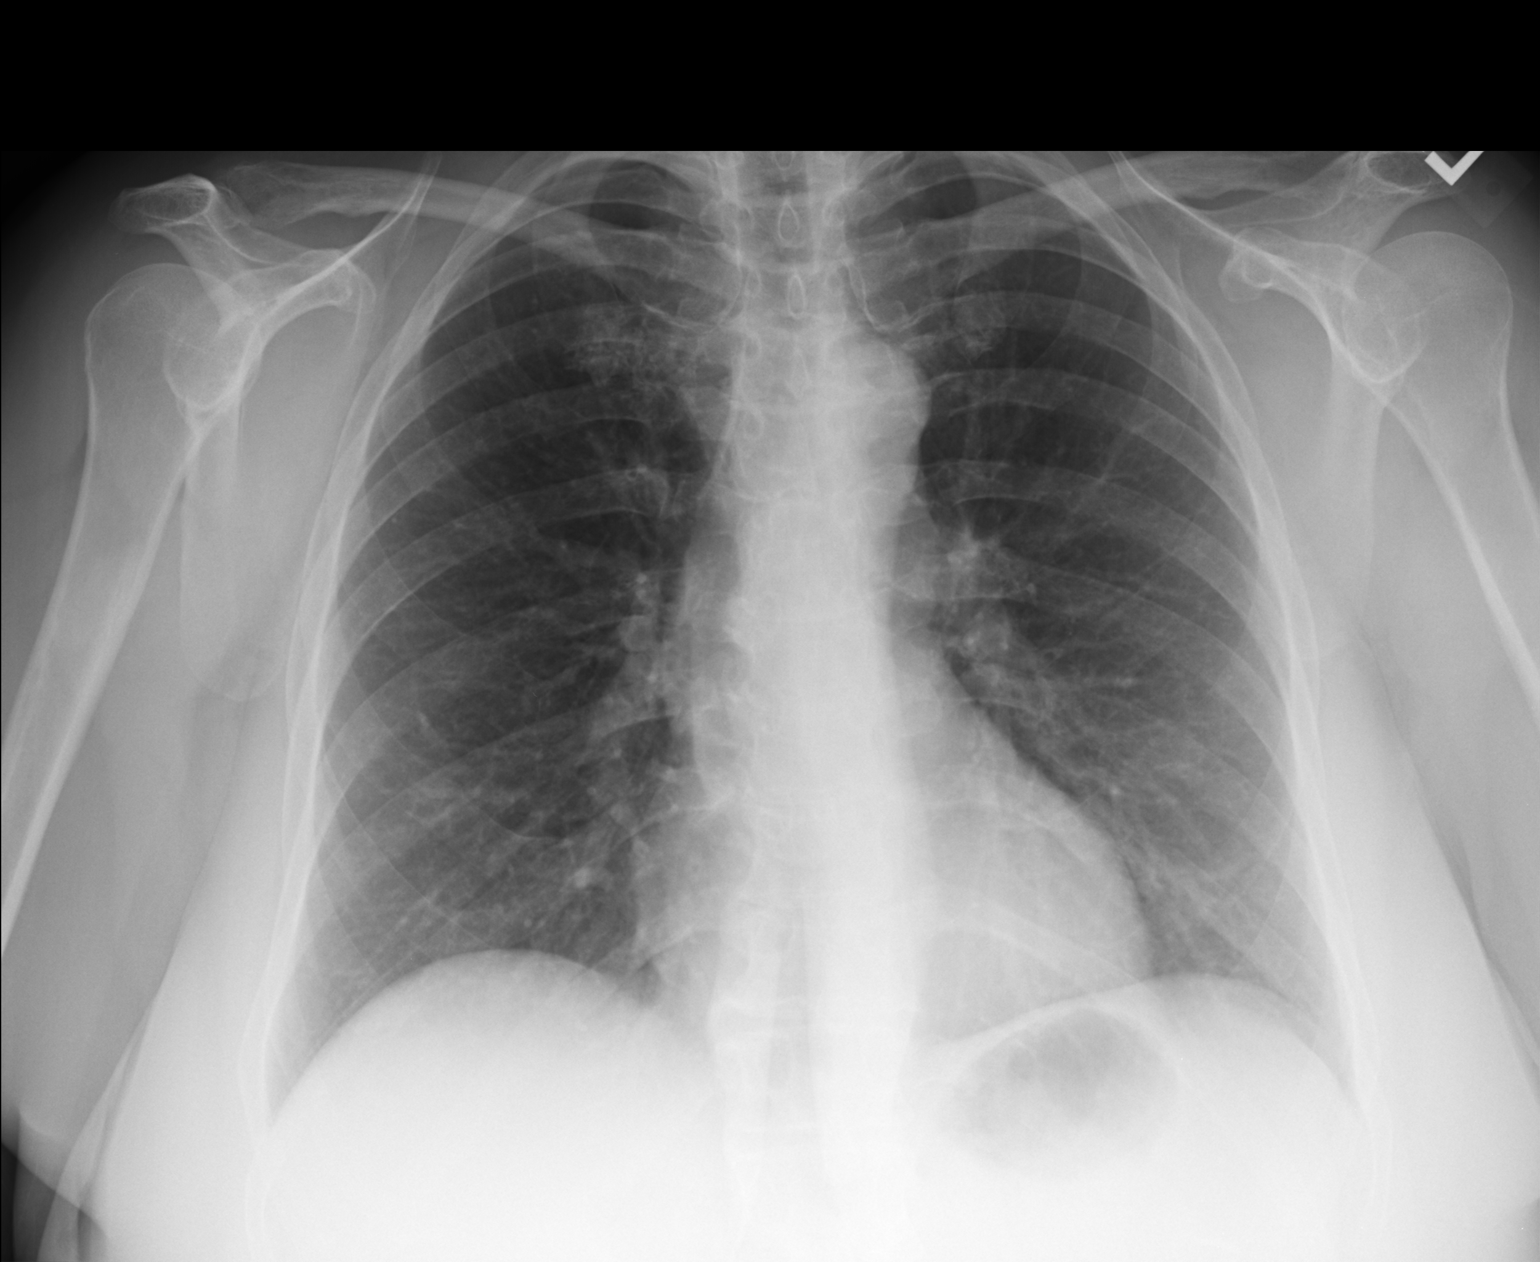

[lateral]
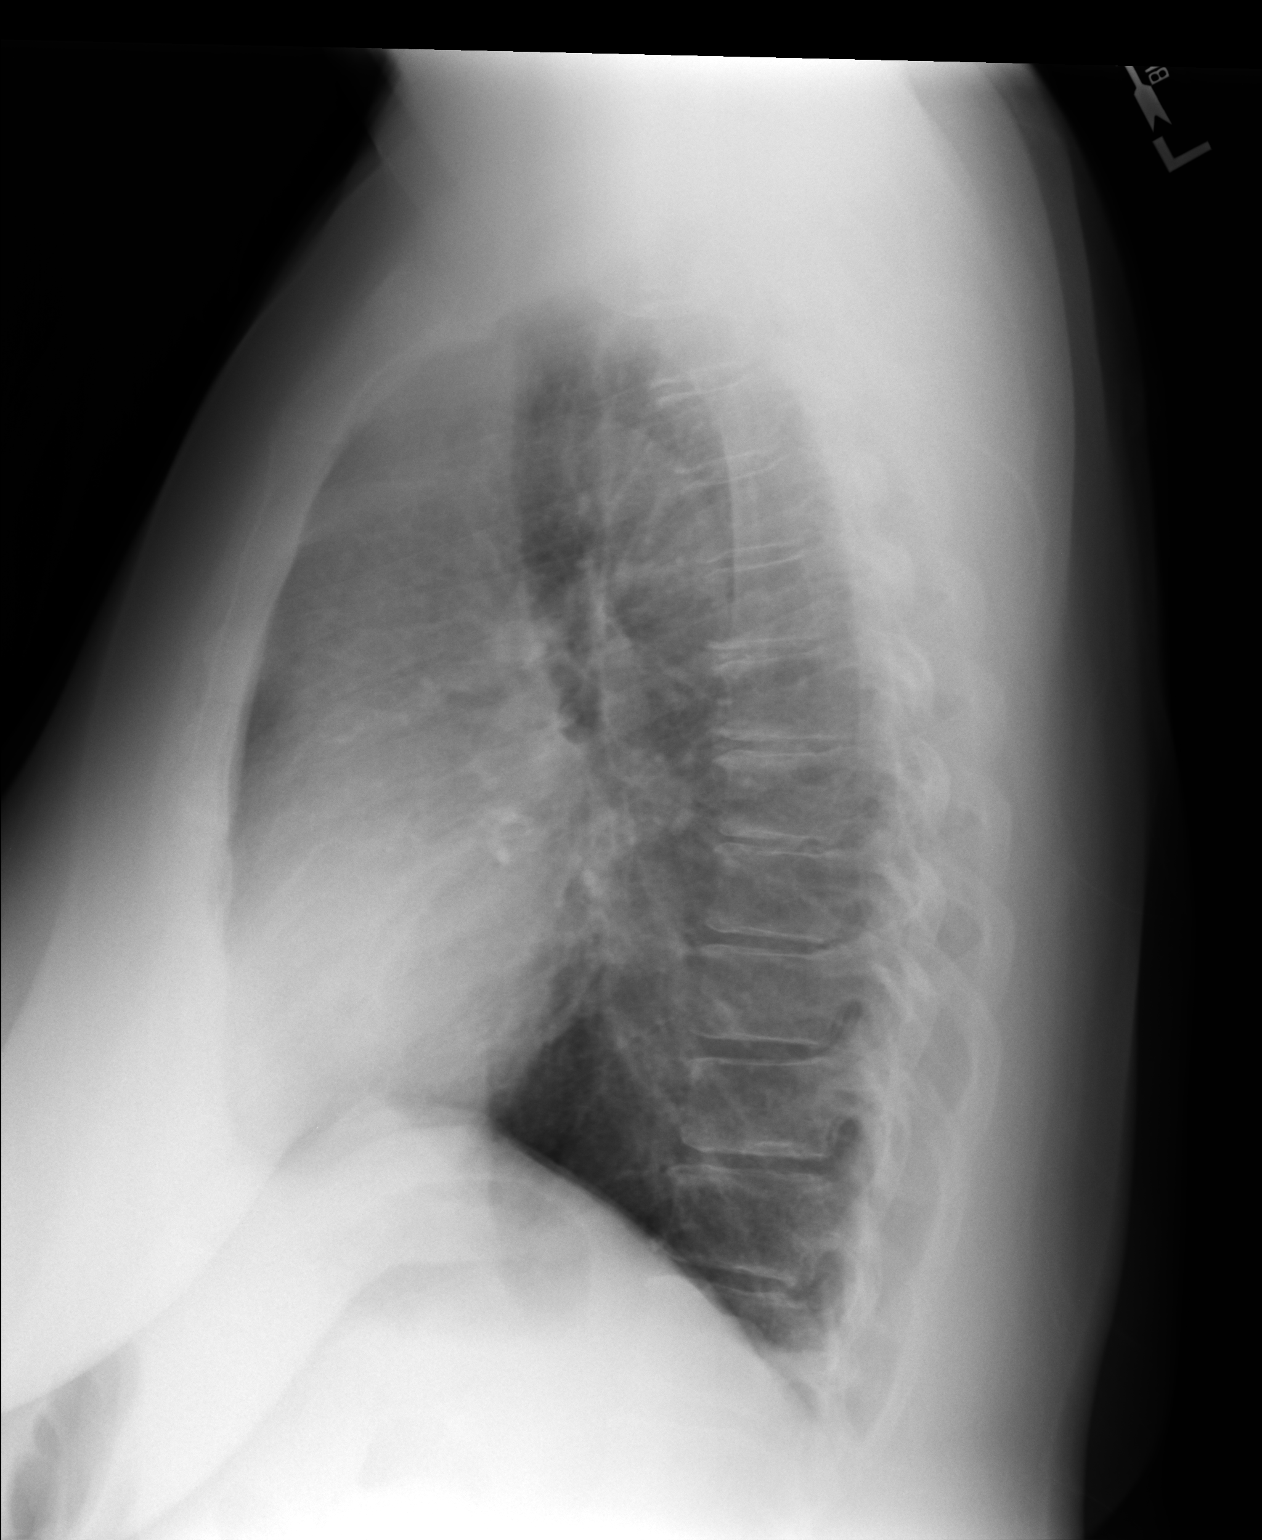

[2 of 2 positions shown; findings below may reference images not displayed]

FINDINGS: Trachea is midline.  Heart size normal.  The lungs are
clear.  No pleural fluid.
IMPRESSION: No acute findings.

## 2013-08-31 ENCOUNTER — Other Ambulatory Visit: Payer: Self-pay | Admitting: Emergency Medicine

## 2013-10-13 ENCOUNTER — Encounter: Payer: Self-pay | Admitting: Family Medicine

## 2014-01-11 ENCOUNTER — Encounter: Payer: Self-pay | Admitting: Family Medicine

## 2014-01-11 ENCOUNTER — Ambulatory Visit (INDEPENDENT_AMBULATORY_CARE_PROVIDER_SITE_OTHER): Payer: 59 | Admitting: Family Medicine

## 2014-01-11 VITALS — BP 140/75 | HR 68 | Temp 97.0°F | Resp 16 | Ht 66.0 in | Wt 257.0 lb

## 2014-01-11 DIAGNOSIS — I10 Essential (primary) hypertension: Secondary | ICD-10-CM

## 2014-01-11 DIAGNOSIS — M545 Low back pain, unspecified: Secondary | ICD-10-CM

## 2014-01-11 DIAGNOSIS — E673 Hypervitaminosis D: Secondary | ICD-10-CM

## 2014-01-11 DIAGNOSIS — L219 Seborrheic dermatitis, unspecified: Secondary | ICD-10-CM

## 2014-01-11 DIAGNOSIS — E785 Hyperlipidemia, unspecified: Secondary | ICD-10-CM

## 2014-01-11 DIAGNOSIS — Z79899 Other long term (current) drug therapy: Secondary | ICD-10-CM

## 2014-01-11 DIAGNOSIS — G43909 Migraine, unspecified, not intractable, without status migrainosus: Secondary | ICD-10-CM

## 2014-01-11 LAB — COMPREHENSIVE METABOLIC PANEL
ALBUMIN: 4.2 g/dL (ref 3.5–5.2)
ALT: 19 U/L (ref 0–35)
AST: 19 U/L (ref 0–37)
Alkaline Phosphatase: 58 U/L (ref 39–117)
BUN: 17 mg/dL (ref 6–23)
CHLORIDE: 101 meq/L (ref 96–112)
CO2: 30 meq/L (ref 19–32)
CREATININE: 0.72 mg/dL (ref 0.50–1.10)
Calcium: 9.7 mg/dL (ref 8.4–10.5)
Glucose, Bld: 93 mg/dL (ref 70–99)
Potassium: 4.1 mEq/L (ref 3.5–5.3)
Sodium: 140 mEq/L (ref 135–145)
TOTAL PROTEIN: 6.8 g/dL (ref 6.0–8.3)
Total Bilirubin: 0.5 mg/dL (ref 0.2–1.2)

## 2014-01-11 MED ORDER — CELECOXIB 200 MG PO CAPS: 200.0000 mg | ORAL_CAPSULE | Freq: Two times a day (BID) | ORAL | Status: DC

## 2014-01-11 MED ORDER — ATENOLOL 25 MG PO TABS: 25.0000 mg | ORAL_TABLET | Freq: Every day | ORAL | Status: DC

## 2014-01-11 MED ORDER — HYDROCHLOROTHIAZIDE 25 MG PO TABS: 25.0000 mg | ORAL_TABLET | Freq: Every day | ORAL | Status: DC

## 2014-01-11 NOTE — Patient Instructions (Addendum)
Choose an omega-3 supp with DHA and EPA  In them - find the cheapest supplement with the most active DHA and EPA in it. Flax seed is a great way to do this, consider niacin and red yeast rice (the latter of which can be increased ineffectiveness w/ coenzyme q10).  It sounds like the lisinopril that you were on prior may have been helping prevent migraine headaches now that you are off of it and having more.  Lets try a different blood pressure medication for prevention of migraines that has more data behind it but if it is not working for you or you are having any side effects, we could always just switch you back to your prior medication.  Seborrheic Dermatitis Seborrheic dermatitis involves pink or red skin with greasy, flaky scales. This is often found on the scalp, eyebrows, nose, bearded area, and on or behind the ears. It can also occur on the central chest. It often occurs where there are more oil (sebaceous) glands. This condition is also known as dandruff. When this condition affects a baby's scalp, it is called cradle cap. It may come and go for no known reason. It can occur at any time of life from infancy to old age. CAUSES  The cause is unknown. It is not the result of too little moisture or too much oil. In some people, seborrheic dermatitis flare-ups seem to be triggered by stress. It also commonly occurs in people with certain diseases such as Parkinson's disease or HIV/AIDS. SYMPTOMS   Thick scales on the scalp.  Redness on the face or in the armpits.  The skin may seem oily or dry, but moisturizers do not help.  In infants, seborrheic dermatitis appears as scaly redness that does not seem to bother the baby. In some babies, it affects only the scalp. In others, it also affects the neck creases, armpits, groin, or behind the ears.  In adults and adolescents, seborrheic dermatitis may affect only the scalp. It may look patchy or spread out, with areas of redness and flaking. Other  areas commonly affected include:  Eyebrows.  Eyelids.  Forehead.  Skin behind the ears.  Outer ears.  Chest.  Armpits.  Nose creases.  Skin creases under the breasts.  Skin between the buttocks.  Groin.  Some adults and adolescents feel itching or burning in the affected areas. DIAGNOSIS  Your caregiver can usually tell what the problem is by doing a physical exam. TREATMENT   Cortisone (steroid) ointments, creams, and lotions can help decrease inflammation.  Babies can be treated with baby oil to soften the scales, then they may be washed with baby shampoo. If this does not help, a prescription topical steroid medicine may work.  Adults can use medicated shampoos.  Your caregiver may prescribe corticosteroid cream and shampoo containing an antifungal or yeast medicine (ketoconazole). Hydrocortisone or anti-yeast cream can be rubbed directly onto seborrheic dermatitis patches. Yeast does not cause seborrheic dermatitis, but it seems to add to the problem. In infants, seborrheic dermatitis is often worst during the first year of life. It tends to disappear on its own as the child grows. However, it may return during the teenage years. In adults and adolescents, seborrheic dermatitis tends to be a long-lasting condition that comes and goes over many years. HOME CARE INSTRUCTIONS   Use prescribed medicines as directed.  In infants, do not aggressively remove the scales or flakes on the scalp with a comb or by other means. This may lead  to hair loss. SEEK MEDICAL CARE IF:   The problem does not improve from the medicated shampoos, lotions, or other medicines given by your caregiver.  You have any other questions or concerns. Document Released: 08/30/2005 Document Revised: 02/29/2012 Document Reviewed: 01/19/2010 [facility] Patient Information 2014 [place]. Fat and Cholesterol Control Diet Fat and cholesterol levels in your blood and organs are influenced by your  diet. High levels of fat and cholesterol may lead to diseases of the heart, small and large blood vessels, gallbladder, liver, and pancreas. CONTROLLING FAT AND CHOLESTEROL WITH DIET Although exercise and lifestyle factors are important, your diet is key. That is because certain foods are known to raise cholesterol and others to lower it. The goal is to balance foods for their effect on cholesterol and more importantly, to replace saturated and trans fat with other types of fat, such as monounsaturated fat, polyunsaturated fat, and omega-3 fatty acids. On average, a person should consume no more than 15 to 17 g of saturated fat daily. Saturated and trans fats are considered "bad" fats, and they will raise LDL cholesterol. Saturated fats are primarily found in animal products such as meats, butter, and cream. However, that does not mean you need to give up all your favorite foods. Today, there are good tasting, low-fat, low-cholesterol substitutes for most of the things you like to eat. Choose low-fat or nonfat alternatives. Choose round or loin cuts of red meat. These types of cuts are lowest in fat and cholesterol. Chicken (without the skin), fish, veal, and ground [place] breast are great choices. Eliminate fatty meats, such as hot dogs and salami. Even shellfish have little or no saturated fat. Have a 3 oz (85 g) portion when you eat lean meat, poultry, or fish. Trans fats are also called "partially hydrogenated oils." They are oils that have been scientifically manipulated so that they are solid at room temperature resulting in a longer shelf life and improved taste and texture of foods in which they are added. Trans fats are found in stick margarine, some tub margarines, cookies, crackers, and baked goods.  When baking and cooking, oils are a great substitute for butter. The monounsaturated oils are especially beneficial since it is believed they lower LDL and raise HDL. The oils you should avoid entirely  are saturated tropical oils, such as coconut and palm.  Remember to eat a lot from food groups that are naturally free of saturated and trans fat, including fish, fruit, vegetables, beans, grains (barley, rice, couscous, bulgur wheat), and pasta (without cream sauces).  IDENTIFYING FOODS THAT LOWER FAT AND CHOLESTEROL  Soluble fiber may lower your cholesterol. This type of fiber is found in fruits such as apples, vegetables such as broccoli, potatoes, and carrots, legumes such as beans, peas, and lentils, and grains such as barley. Foods fortified with plant sterols (phytosterol) may also lower cholesterol. You should eat at least 2 g per day of these foods for a cholesterol lowering effect.  Read package labels to identify low-saturated fats, trans fat free, and low-fat foods at the supermarket. Select cheeses that have only 2 to 3 g saturated fat per ounce. Use a heart-healthy tub margarine that is free of trans fats or partially hydrogenated oil. When buying baked goods (cookies, crackers), avoid partially hydrogenated oils. Breads and muffins should be made from whole grains (whole-wheat or whole oat flour, instead of "flour" or "enriched flour"). Buy non-creamy canned soups with reduced salt and no added fats.  FOOD PREPARATION TECHNIQUES  Never deep-fry. If you must fry, either stir-fry, which uses very little fat, or use non-stick cooking sprays. When possible, broil, bake, or roast meats, and steam vegetables. Instead of putting butter or margarine on vegetables, use lemon and herbs, applesauce, and cinnamon (for squash and sweet potatoes). Use nonfat yogurt, salsa, and low-fat dressings for salads.  LOW-SATURATED FAT / LOW-FAT FOOD SUBSTITUTES Meats / Saturated Fat (g)  Avoid: Steak, marbled (3 oz/85 g) / 11 g  Choose: Steak, lean (3 oz/85 g) / 4 g  Avoid: Hamburger (3 oz/85 g) / 7 g  Choose: Hamburger, lean (3 oz/85 g) / 5 g  Avoid: Ham (3 oz/85 g) / 6 g  Choose: Ham, lean cut (3 oz/85  g) / 2.4 g  Avoid: Chicken, with skin, dark meat (3 oz/85 g) / 4 g  Choose: Chicken, skin removed, dark meat (3 oz/85 g) / 2 g  Avoid: Chicken, with skin, light meat (3 oz/85 g) / 2.5 g  Choose: Chicken, skin removed, light meat (3 oz/85 g) / 1 g Dairy / Saturated Fat (g)  Avoid: Whole milk (1 cup) / 5 g  Choose: Low-fat milk, 2% (1 cup) / 3 g  Choose: Low-fat milk, 1% (1 cup) / 1.5 g  Choose: Skim milk (1 cup) / 0.3 g  Avoid: Hard cheese (1 oz/28 g) / 6 g  Choose: Skim milk cheese (1 oz/28 g) / 2 to 3 g  Avoid: Cottage cheese, 4% fat (1 cup) / 6.5 g  Choose: Low-fat cottage cheese, 1% fat (1 cup) / 1.5 g  Avoid: Ice cream (1 cup) / 9 g  Choose: Sherbet (1 cup) / 2.5 g  Choose: Nonfat frozen yogurt (1 cup) / 0.3 g  Choose: Frozen fruit bar / trace  Avoid: Whipped cream (1 tbs) / 3.5 g  Choose: Nondairy whipped topping (1 tbs) / 1 g Condiments / Saturated Fat (g)  Avoid: Mayonnaise (1 tbs) / 2 g  Choose: Low-fat mayonnaise (1 tbs) / 1 g  Avoid: Butter (1 tbs) / 7 g  Choose: Extra light margarine (1 tbs) / 1 g  Avoid: Coconut oil (1 tbs) / 11.8 g  Choose: Olive oil (1 tbs) / 1.8 g  Choose: Corn oil (1 tbs) / 1.7 g  Choose: Safflower oil (1 tbs) / 1.2 g  Choose: Sunflower oil (1 tbs) / 1.4 g  Choose: Soybean oil (1 tbs) / 2.4 g  Choose: Canola oil (1 tbs) / 1 g Document Released: 08/30/2005 Document Revised: 12/25/2012 Document Reviewed: 02/18/2011 [employer] Patient Information 2014 [place], [place].

## 2014-01-11 NOTE — Progress Notes (Signed)
Subjective:    Patient ID: Crystal Garner, female    DOB: [birth year], 58 y.o.   MRN: [id number] Chief Complaint  Patient presents with  . Follow-up    4 months  . Medication Refill    HPI  Craves eggs - doesn't really like egg whites alone. Is a vegetarian.  Her partner (female) is very slim and can eat whatever she wants w/o any weight trouble - her partner loves butter, snacks, junk food so Crystal Garner has a lot of trouble sticking to a low chol, low salt diet - feels like her partner unintentionally sabotages her.  No soft drinks, limits sugar, eats fruit at least twice a day.  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  Has not made any sig changes to her diet since her last lipid panel.  Can't use fish oil as vegetarian but used to use a lot of flax seed oil to cook with but partner didn't like it.  Problem is not enough exercise as back pain is increasing. Had 3 cortisone shots and the last 2 didn't help. Seeing Dr. Antonietta Jewel for this - he is really a great help to her. Taking naproxyn once a day but she can tell when it is wearing off -so thinking about switching to celebrex or meloxicam on the recommendation of Dr. Antonietta Jewel.  Notices skin is really dry - especially hands - knows she doesn't drink enough water but never feels thirsty (her whole life has been a challenge to keep hydrated), skin is very sensitive to lotions/ingredients.  BP 130s/90s at home, no problem with hctz.  Has had more HAs recently since we stopped her lisinopril. Concerned about prophylactic medicine - just doesn't like to take new meds, concerned about side effects.  Feels like migraines are worse when she is not eating properly.    Taking 1 tab vit D daily only.  Past Medical History  Diagnosis Date  . Depression   . Thyroid disease   . Hypertension    Current Outpatient Prescriptions on File Prior to Visit  Medication Sig Dispense Refill  . cholecalciferol (VITAMIN D) 1000 UNITS tablet Take  1,000 Units by mouth 3 (three) times a week.      . hydrochlorothiazide (HYDRODIURIL) 25 MG tablet Take 1 tablet (25 mg total) by mouth daily.  90 tablet  1  . levothyroxine (SYNTHROID, LEVOTHROID) 150 MCG tablet TAKE ONE TABLET BY MOUTH EVERY DAY  30 tablet  11  . SUMAtriptan (IMITREX) 100 MG tablet TAKE ONE TABLET BY MOUTH EVERY 2 HOURS AS NEEDED FOR MIGRAINE  10 tablet  11   No current facility-administered medications on file prior to visit.   Allergies  Allergen Reactions  . Shellfish Allergy Anaphylaxis and Rash  . Amoxicillin   . Codeine   . Penicillins       Review of Systems  Constitutional: Positive for fatigue. Negative for fever, chills, diaphoresis and appetite change.  Eyes: Negative for visual disturbance.  Respiratory: Negative for cough and shortness of breath.   Cardiovascular: Negative for chest pain, palpitations and leg swelling.  Endocrine: Positive for polyphagia. Negative for polydipsia and polyuria.  Genitourinary: Negative for frequency, decreased urine volume, enuresis, genital sores and pelvic pain.       High libido  Musculoskeletal: Positive for arthralgias and back pain.  Skin: Positive for rash. Negative for color change and wound.  Neurological: Positive for headaches. Negative for syncope.  Hematological: Does not bruise/bleed easily.  BP 140/75  Pulse 68  Temp(Src) 97 F (36.1 C)  Resp 16  Ht 5\' 6"  (1.676 m)  Wt 257 lb (116.574 kg)  BMI 41.50 kg/m2  SpO2 90% Objective:   Physical Exam  Constitutional: She is oriented to person, place, and time. She appears well-developed and well-nourished. No distress.  HENT:  Head: Normocephalic and atraumatic.  Right Ear: External ear normal.  Left Ear: External ear normal.  Eyes: Conjunctivae are normal. No scleral icterus.  Neck: Normal range of motion. Neck supple. No thyromegaly present.  Cardiovascular: Normal rate, regular rhythm, normal heart sounds and intact distal pulses.     Pulmonary/Chest: Effort normal and breath sounds normal. No respiratory distress.  Musculoskeletal: She exhibits no edema.  Lymphadenopathy:    She has no cervical adenopathy.  Neurological: She is alert and oriented to person, place, and time.  Skin: Skin is warm and dry. Rash noted. She is not diaphoretic. No erythema.  Scaly plaque on erythematous base in Rt ear  Psychiatric: She has a normal mood and affect. Her behavior is normal.       Assessment & Plan:  Seborrheic dermatitis - has a rx topical cream at home which works well, discussed trial of selsun blue shampoo if worsening  Severe obesity (BMI >= 40) - going to continue working on diet and exercise.  Migraine, unspecified, without mention of intractable migraine without mention of status migrainosus - Plan: Comprehensive metabolic panel - has had more HAs since stopping lisinopril. Start trial of atenolol for preventative as well as bp control.  Encounter for long-term (current) use of other medications - Plan: Comprehensive metabolic panel  LBP (low back pain) - followed by ortho, start trial of celebrex for pain, stop naprosyn  Essential hypertension, benign - was on lisinopril-hctz prior. Now on hctz alone and bp above goal so start atenolol hoping it will decrease migraines. If does not tolerate atenolol, consider verapamil or restart lisinopril.  Other and unspecified hyperlipidemia - trying hard through diet already, will start omega-3 supplements (vegetarian) like flax seed oil and recheck at f/u in 4-6 mos.  Vitamin D deficiency - Increase vit D to 2000u/d from 1000  HM - rec sched CPE. Needs TdaP.  Meds ordered this encounter  Medications  . DISCONTD: naproxen (NAPROSYN) 500 MG tablet    Sig: Take 500 mg by mouth 2 (two) times daily with a meal.  . celecoxib (CELEBREX) 200 MG capsule    Sig: Take 1 capsule (200 mg total) by mouth 2 (two) times daily.    Dispense:  30 capsule    Refill:  5  .  hydrochlorothiazide (HYDRODIURIL) 25 MG tablet    Sig: Take 1 tablet (25 mg total) by mouth daily.    Dispense:  90 tablet    Refill:  1  . atenolol (TENORMIN) 25 MG tablet    Sig: Take 1 tablet (25 mg total) by mouth daily.    Dispense:  90 tablet    Refill:  1    [name], MD MPH

## 2014-01-13 ENCOUNTER — Encounter: Payer: Self-pay | Admitting: Family Medicine

## 2014-05-10 ENCOUNTER — Ambulatory Visit: Payer: 59 | Admitting: Family Medicine

## 2014-05-10 ENCOUNTER — Telehealth: Payer: Self-pay

## 2014-05-10 MED ORDER — HYDROCHLOROTHIAZIDE 25 MG PO TABS: 25.0000 mg | ORAL_TABLET | Freq: Every day | ORAL | Status: DC

## 2014-05-10 MED ORDER — ATENOLOL 25 MG PO TABS: 25.0000 mg | ORAL_TABLET | Freq: Every day | ORAL | Status: DC

## 2014-05-10 MED ORDER — LEVOTHYROXINE SODIUM 150 MCG PO TABS: ORAL_TABLET | ORAL | Status: DC

## 2014-05-10 NOTE — Telephone Encounter (Signed)
Patient needs refill on these medications: Patient sees Dr. Brigitte Pulse.  .hydrochlorothiazide (HYDRODIURIL) 25 MG tablet levothyroxine (SYNTHROID, LEVOTHROID) 150 MCG tablet atenolol (TENORMIN) 25 MG tablet  Please call when ready.  [phone]

## 2014-05-10 NOTE — Telephone Encounter (Signed)
Rxs sent and pt notified  

## 2014-05-17 ENCOUNTER — Ambulatory Visit: Payer: 59 | Admitting: Family Medicine

## 2014-05-31 ENCOUNTER — Ambulatory Visit (INDEPENDENT_AMBULATORY_CARE_PROVIDER_SITE_OTHER): Payer: 59 | Admitting: Family Medicine

## 2014-05-31 ENCOUNTER — Encounter: Payer: Self-pay | Admitting: Family Medicine

## 2014-05-31 VITALS — BP 110/80 | HR 69 | Temp 98.2°F | Resp 16 | Ht 66.0 in | Wt 255.0 lb

## 2014-05-31 DIAGNOSIS — M545 Low back pain, unspecified: Secondary | ICD-10-CM

## 2014-05-31 DIAGNOSIS — E039 Hypothyroidism, unspecified: Secondary | ICD-10-CM

## 2014-05-31 DIAGNOSIS — I10 Essential (primary) hypertension: Secondary | ICD-10-CM

## 2014-05-31 DIAGNOSIS — E785 Hyperlipidemia, unspecified: Secondary | ICD-10-CM

## 2014-05-31 DIAGNOSIS — Z79899 Other long term (current) drug therapy: Secondary | ICD-10-CM

## 2014-05-31 DIAGNOSIS — Z23 Encounter for immunization: Secondary | ICD-10-CM

## 2014-05-31 LAB — COMPLETE METABOLIC PANEL WITH GFR
ALT: 16 U/L (ref 0–35)
AST: 17 U/L (ref 0–37)
Albumin: 4.3 g/dL (ref 3.5–5.2)
Alkaline Phosphatase: 63 U/L (ref 39–117)
BILIRUBIN TOTAL: 0.4 mg/dL (ref 0.2–1.2)
BUN: 19 mg/dL (ref 6–23)
CALCIUM: 10.7 mg/dL — AB (ref 8.4–10.5)
CHLORIDE: 99 meq/L (ref 96–112)
CO2: 29 meq/L (ref 19–32)
Creat: 0.83 mg/dL (ref 0.50–1.10)
GFR, Est African American: 89 mL/min
GFR, Est Non African American: 77 mL/min
Glucose, Bld: 91 mg/dL (ref 70–99)
Potassium: 4.4 mEq/L (ref 3.5–5.3)
SODIUM: 139 meq/L (ref 135–145)
Total Protein: 7 g/dL (ref 6.0–8.3)

## 2014-05-31 MED ORDER — SUMATRIPTAN SUCCINATE 100 MG PO TABS: ORAL_TABLET | ORAL | Status: DC

## 2014-05-31 MED ORDER — LEVOTHYROXINE SODIUM 150 MCG PO TABS: ORAL_TABLET | ORAL | Status: DC

## 2014-05-31 MED ORDER — HYDROCHLOROTHIAZIDE 25 MG PO TABS: 25.0000 mg | ORAL_TABLET | Freq: Every day | ORAL | Status: DC

## 2014-05-31 MED ORDER — ATENOLOL 25 MG PO TABS: 25.0000 mg | ORAL_TABLET | Freq: Every day | ORAL | Status: DC

## 2014-05-31 NOTE — Progress Notes (Signed)
Subjective:  This chart was scribed for Crystal Cheadle, MD by [name], Medial Scribe. This patient was seen in room 24 and the patient's care was started at 2:28 PM.   Patient ID: Crystal Garner, female    DOB: 07-Nov-1955, 58 y.o.   MRN: [id number]  Chief Complaint  Patient presents with  . Medication Refill    ALL RXs, but will discuss them with you,also needs HANDICAP PLACARD    HPI HPI Comments: Crystal Garner is a 58 y.o. female with history of obesity, Hypertension, Hypothyroid, and lower back pain, who presents to the [facility] requesting medication refills for all her current prescriptions. Patient was on Lisinopril HTC for blood pressure but this was discontinued. Patient kept on HTZ, but her headaches worsened and blood pressure elevated. Patient started on Atenolol to alleviate migraine symptoms. At last visit started on Omega 3 to help her cholesterol and her Vitamin D supplement was increased.  Patient's blood pressure is much improved today. In regards to her back pain: patient received her fourth injection two weeks ago for treatment, but she denies relief. Dr. Antonietta Jewel has ordered an MRI to further access her problem. Patient states that she is no longer taking Celebrex; she is taking Naproxen. She denies effectiveness.   Patient states that her attempts to include fish oil, flaxseed, and vegetarian alternatives in her diet has not been successful. She attributes her inability to adhere to the diet to her partner who is a "very picky eater." Patient emphatically refuses cholesterol medication.   Patient endorses increase her Vitamin D since her last visit: she is now taking 1000mg  unit twice a day.  Patient states that her feet swelling and headaches have become less frequent since starting Atenolol HTZ.   Patient informed that she is due for Tetanus vaccination.    Patient Active Problem List   Diagnosis Date Noted  . Other and unspecified hyperlipidemia  01/11/2014  . Essential hypertension, benign 08/17/2013  . LBP (low back pain) 08/07/2012  . Hypothyroid 08/07/2012  . Former smoker 08/07/2012   Past Medical History  Diagnosis Date  . Depression   . Thyroid disease   . Hypertension    No past surgical history on file. Allergies  Allergen Reactions  . Shellfish Allergy Anaphylaxis and Rash  . Amoxicillin   . Codeine   . Penicillins    Prior to Admission medications   Medication Sig Start Date End Date Taking? Authorizing Provider  atenolol (TENORMIN) 25 MG tablet Take 1 tablet (25 mg total) by mouth daily. 05/10/14  Yes Shawnee Knapp, MD  celecoxib (CELEBREX) 200 MG capsule Take 1 capsule (200 mg total) by mouth 2 (two) times daily. 01/11/14  Yes Shawnee Knapp, MD  cholecalciferol (VITAMIN D) 1000 UNITS tablet Take 1,000 Units by mouth 3 (three) times a week.   Yes Historical Provider, MD  hydrochlorothiazide (HYDRODIURIL) 25 MG tablet Take 1 tablet (25 mg total) by mouth daily. 05/10/14  Yes Shawnee Knapp, MD  levothyroxine (SYNTHROID, LEVOTHROID) 150 MCG tablet TAKE ONE TABLET BY MOUTH EVERY DAY 05/10/14  Yes Shawnee Knapp, MD  SUMAtriptan (IMITREX) 100 MG tablet TAKE ONE TABLET BY MOUTH EVERY 2 HOURS AS NEEDED FOR MIGRAINE 08/17/13  Yes Shawnee Knapp, MD   History   Social History  . Marital Status: Significant Other    Spouse Name: N/A    Number of Children: N/A  . Years of Education: N/A   Occupational History  .  Not on file.   Social History Main Topics  . Smoking status: Former Research scientist (life sciences)  . Smokeless tobacco: Not on file  . Alcohol Use: 0.0 oz/week    3-4 Glasses of wine per week  . Drug Use: No  . Sexual Activity: Yes    Partners: Female    Birth Control/ Protection: Post-menopausal   Other Topics Concern  . Not on file   Social History Narrative  . No narrative on file    Review of Systems  Constitutional: Negative for fever and chills.  Cardiovascular: Negative for leg swelling.  Musculoskeletal: Negative for back pain.   Neurological: Negative for headaches.       Objective:   Physical Exam  Nursing note and vitals reviewed. Constitutional: She is oriented to person, place, and time. She appears well-developed and well-nourished. No distress.  HENT:  Head: Normocephalic and atraumatic.  Eyes: EOM are normal.  Neck: Normal range of motion. Neck supple. No thyromegaly present.  No cervical adenopathy or thyroid masses.   Cardiovascular: Normal rate, regular rhythm and normal heart sounds.   No murmur heard. Pulmonary/Chest: Effort normal and breath sounds normal. No respiratory distress. She has no wheezes. She has no rales.  Lungs clear to ascultation bilaterally.   Musculoskeletal: Normal range of motion.  Lymphadenopathy:    She has no cervical adenopathy.  Neurological: She is alert and oriented to person, place, and time.  Skin: Skin is warm and dry.  Psychiatric: She has a normal mood and affect. Her behavior is normal.    . Filed Vitals:   05/31/14 1339  BP: 110/80  Pulse: 69  Temp: 98.2 F (36.8 C)  TempSrc: Oral  Resp: 16  Height: 5\' 6"  (1.676 m)  Weight: 255 lb (115.667 kg)  SpO2: 96%       Assessment & Plan:   Need for prophylactic vaccination and inoculation against influenza - Plan: Flu Vaccine QUAD 36+ mos IM  Other and unspecified hyperlipidemia - rec flp at f/u  Low back pain, unspecified back pain laterality, with sciatica presence unspecified  Hypothyroidism, unspecified hypothyroidism type - Plan: TSH - tsh wnml, continue on same dose of 150, recheck at f/u OV in 6 mos.  Essential hypertension, benign - Plan: COMPLETE METABOLIC PANEL WITH GFR, Vit D  25 hydroxy (rtn osteoporosis monitoring) - much improved control on atenolol and hctz, continue  Encounter for long-term (current) use of other medications - Plan: Vit D  25 hydroxy (rtn osteoporosis monitoring)  Meds ordered this encounter  Medications  . SUMAtriptan (IMITREX) 100 MG tablet    Sig: TAKE ONE  TABLET BY MOUTH EVERY 2 HOURS AS NEEDED FOR MIGRAINE    Dispense:  10 tablet    Refill:  11  . levothyroxine (SYNTHROID, LEVOTHROID) 150 MCG tablet    Sig: TAKE ONE TABLET BY MOUTH EVERY DAY    Dispense:  90 tablet    Refill:  1  . hydrochlorothiazide (HYDRODIURIL) 25 MG tablet    Sig: Take 1 tablet (25 mg total) by mouth daily.    Dispense:  90 tablet    Refill:  1  . atenolol (TENORMIN) 25 MG tablet    Sig: Take 1 tablet (25 mg total) by mouth daily.    Dispense:  90 tablet    Refill:  1    I personally performed the services described in this documentation, which was scribed in my presence. The recorded information has been reviewed and considered, and addended by me as needed.  Crystal Cheadle, MD MPH  Results for orders placed in visit on 05/31/14  COMPLETE METABOLIC PANEL WITH GFR      Result Value Ref Range   Sodium 139  135 - 145 mEq/L   Potassium 4.4  3.5 - 5.3 mEq/L   Chloride 99  96 - 112 mEq/L   CO2 29  19 - 32 mEq/L   Glucose, Bld 91  70 - 99 mg/dL   BUN 19  6 - 23 mg/dL   Creat 0.83  0.50 - 1.10 mg/dL   Total Bilirubin 0.4  0.2 - 1.2 mg/dL   Alkaline Phosphatase 63  39 - 117 U/L   AST 17  0 - 37 U/L   ALT 16  0 - 35 U/L   Total Protein 7.0  6.0 - 8.3 g/dL   Albumin 4.3  3.5 - 5.2 g/dL   Calcium 10.7 (*) 8.4 - 10.5 mg/dL   GFR, Est African American 89     GFR, Est Non African American 77    TSH      Result Value Ref Range   TSH 4.254  0.350 - 4.500 uIU/mL  VITAMIN D 25 HYDROXY      Result Value Ref Range   Vit D, 25-Hydroxy 43  30 - 89 ng/mL

## 2014-06-01 LAB — TSH: TSH: 4.254 u[IU]/mL (ref 0.350–4.500)

## 2014-06-01 LAB — VITAMIN D 25 HYDROXY (VIT D DEFICIENCY, FRACTURES): Vit D, 25-Hydroxy: 43 ng/mL (ref 30–89)

## 2014-06-02 ENCOUNTER — Encounter: Payer: Self-pay | Admitting: Family Medicine

## 2015-02-21 ENCOUNTER — Encounter: Payer: Self-pay | Admitting: *Deleted

## 2015-03-02 ENCOUNTER — Other Ambulatory Visit: Payer: Self-pay | Admitting: Family Medicine

## 2015-03-03 NOTE — Telephone Encounter (Signed)
Pt would like to know if she can get her thyroid medicine refilled, should have had an order in for her blood work each time but would need to have her medicine by Friday. Please call [place]

## 2015-03-04 ENCOUNTER — Telehealth: Payer: Self-pay

## 2015-03-04 MED ORDER — LEVOTHYROXINE SODIUM 150 MCG PO TABS: 150.0000 ug | ORAL_TABLET | Freq: Every day | ORAL | Status: DC

## 2015-03-04 NOTE — Telephone Encounter (Signed)
Re-sent medication. Left message on machine letting pt know.

## 2015-03-04 NOTE — Telephone Encounter (Signed)
Pt says she needs a refill on levothyroxine (SYNTHROID, LEVOTHROID) 150 MCG tablet [[id number]]  ;however I see we filled it Receipt confirmed by pharmacy (03/03/2015 4:54 PM EDT). I told her this, she said they didn't have it. Please advise at [phone]. She is very frustrated with this process.

## 2015-03-14 ENCOUNTER — Telehealth: Payer: Self-pay

## 2015-03-14 NOTE — Telephone Encounter (Signed)
Pt is requesting a refill of her thyroid medicine and blood pressure medicine. She wasn't sure if she needed to RTC to get these refills.

## 2015-03-17 MED ORDER — HYDROCHLOROTHIAZIDE 25 MG PO TABS: 25.0000 mg | ORAL_TABLET | Freq: Every day | ORAL | Status: DC

## 2015-03-17 MED ORDER — ATENOLOL 25 MG PO TABS: 25.0000 mg | ORAL_TABLET | Freq: Every day | ORAL | Status: DC

## 2015-03-17 NOTE — Telephone Encounter (Signed)
RFd meds for 1 mos w/note that pt needs OV for more. Also Called pt and explained need for f/up which she was aware of, but advised I gave her RFs to give her time to get in. Also answered pt's questions concerning protocol as far as f/up and Rx RFs, and different ways she can manage seeing her doctor at this practice. Pt thanked me for answering her questions, she had not gotten satisfactory answers in the past, but stated maybe she didn't ask the right questions.

## 2015-03-19 ENCOUNTER — Ambulatory Visit (INDEPENDENT_AMBULATORY_CARE_PROVIDER_SITE_OTHER): Payer: 59 | Admitting: Family Medicine

## 2015-03-19 VITALS — BP 120/74 | HR 63 | Temp 98.1°F | Resp 18 | Ht 65.5 in | Wt 253.0 lb

## 2015-03-19 DIAGNOSIS — Z79899 Other long term (current) drug therapy: Secondary | ICD-10-CM

## 2015-03-19 DIAGNOSIS — I10 Essential (primary) hypertension: Secondary | ICD-10-CM

## 2015-03-19 DIAGNOSIS — R011 Cardiac murmur, unspecified: Secondary | ICD-10-CM

## 2015-03-19 DIAGNOSIS — M545 Low back pain: Secondary | ICD-10-CM | POA: Diagnosis not present

## 2015-03-19 DIAGNOSIS — E039 Hypothyroidism, unspecified: Secondary | ICD-10-CM | POA: Diagnosis not present

## 2015-03-19 LAB — COMPREHENSIVE METABOLIC PANEL
ALBUMIN: 4.3 g/dL (ref 3.5–5.2)
ALK PHOS: 71 U/L (ref 39–117)
ALT: 15 U/L (ref 0–35)
AST: 15 U/L (ref 0–37)
BILIRUBIN TOTAL: 0.4 mg/dL (ref 0.2–1.2)
BUN: 22 mg/dL (ref 6–23)
CO2: 27 mEq/L (ref 19–32)
Calcium: 9.9 mg/dL (ref 8.4–10.5)
Chloride: 99 mEq/L (ref 96–112)
Creat: 0.68 mg/dL (ref 0.50–1.10)
GLUCOSE: 99 mg/dL (ref 70–99)
POTASSIUM: 4.1 meq/L (ref 3.5–5.3)
SODIUM: 142 meq/L (ref 135–145)
TOTAL PROTEIN: 7 g/dL (ref 6.0–8.3)

## 2015-03-19 LAB — TSH: TSH: 0.996 u[IU]/mL (ref 0.350–4.500)

## 2015-03-19 MED ORDER — HYDROCHLOROTHIAZIDE 25 MG PO TABS: 25.0000 mg | ORAL_TABLET | Freq: Every day | ORAL | Status: DC

## 2015-03-19 MED ORDER — ATENOLOL 25 MG PO TABS: 25.0000 mg | ORAL_TABLET | Freq: Every day | ORAL | Status: DC

## 2015-03-19 MED ORDER — LEVOTHYROXINE SODIUM 150 MCG PO TABS: ORAL_TABLET | ORAL | Status: DC

## 2015-03-19 NOTE — Progress Notes (Addendum)
Subjective:    Patient ID: Crystal Garner, female    DOB: November 16, 1955, 59 y.o.   MRN: [id number] This chart was scribed for [name], MD by Marti Sleigh, Medical Scribe. This patient was seen in Room 1 and the patient's care was started a 3:26 PM.  Chief Complaint  Patient presents with  . Medication Refill    90 supply hctz, atenolol, levothyroid    HPI HPI Comments: Crystal Garner is a 59 y.o. female who presents to [facility] for a medication refill. Pt is taking vitamin D, 2000iu per day.   Pt states she is taking flax oil, a couple of times per month. Pt states she is not interested on going on cholesterol medication, so sees no reason to check her cholesterol levels. Pt states she has started drinking 1/2 of a caffeine free pepsi per night, which helps with her digestion. She states if she does not drink a pepsi she has to take a heartburn medication which she does not want to do. Pt states she takes a 25mg  benadryl most nights for sleep. Pt states she checks her BP outside the office every other month and it has been normal. Pt states three years ago her BP bottomed out a couple of times with associated light headedness, but it has not happened in three years. Pt states her back pain has been much improved since she got a bilateral epidural six months ago. Pt states the pain began to worsen recently and she went back for two more shots and has felt great since. Pt denies CP, palpitations, skipped beats or trouble breathing while laying on her stomach.   Her wife, Crystal Garner, 49, 02/07/66, is currently being worked up for a lower abdominal mass - she is going to have surgery no matter what but her CA 125 will be back tonight or tomorrow to see if concern for ovarian cancer.  Pt rare migraines, imitrex always works  Transport planner well.  Depression screen [facility] 2/9 03/19/2015 05/31/2014 01/11/2014  Decreased Interest 0 0 0  Down, Depressed, Hopeless 0 0 1  PHQ - 2 Score 0 0 1      Patient Active  Problem List   Diagnosis Date Noted  . Other and unspecified hyperlipidemia 01/11/2014  . Essential hypertension, benign 08/17/2013  . LBP (low back pain) 08/07/2012  . Hypothyroid 08/07/2012  . Former smoker 08/07/2012   Allergies  Allergen Reactions  . Shellfish Allergy Anaphylaxis and Rash  . Amoxicillin   . Codeine   . Penicillins    Current Outpatient Prescriptions on File Prior to Visit  Medication Sig Dispense Refill  . atenolol (TENORMIN) 25 MG tablet Take 1 tablet (25 mg total) by mouth daily. PATIENT NEEDS OFFICE VISIT FOR ADDITIONAL REFILLS 30 tablet 0  . cholecalciferol (VITAMIN D) 1000 UNITS tablet Take 1,000 Units by mouth 3 (three) times a week.    . hydrochlorothiazide (HYDRODIURIL) 25 MG tablet Take 1 tablet (25 mg total) by mouth daily. PATIENT NEEDS OFFICE VISIT FOR ADDITIONAL REFILLS 30 tablet 0  . levothyroxine (SYNTHROID, LEVOTHROID) 150 MCG tablet TAKE ONE TABLET BY MOUTH EVERY DAY 90 tablet 1  . SUMAtriptan (IMITREX) 100 MG tablet TAKE ONE TABLET BY MOUTH EVERY 2 HOURS AS NEEDED FOR MIGRAINE 10 tablet 11   No current facility-administered medications on file prior to visit.    Review of Systems  Constitutional: Negative for fever and chills.  Eyes: Negative for visual disturbance.  Respiratory: Negative for chest tightness, shortness of breath  and wheezing.   Cardiovascular: Negative for chest pain, palpitations and leg swelling.  Gastrointestinal: Negative for abdominal pain and constipation.  Neurological: Negative for dizziness, light-headedness and headaches.  Psychiatric/Behavioral: Positive for sleep disturbance. Negative for confusion and dysphoric mood. The patient is not nervous/anxious.        Objective:   BP 120/74 mmHg  Pulse 63  Temp(Src) 98.1 F (36.7 C) (Oral)  Resp 18  Ht 5' 5.5" (1.664 m)  Wt 253 lb (114.76 kg)  BMI 41.45 kg/m2  SpO2 98%   Physical Exam  Constitutional: She is oriented to person, place, and time. She appears  well-developed and well-nourished. No distress.  HENT:  Head: Normocephalic and atraumatic.  Mouth/Throat: No oropharyngeal exudate.  Eyes: Pupils are equal, round, and reactive to light.  Neck: Normal range of motion. Neck supple. No thyromegaly present.  Cardiovascular: Normal rate and regular rhythm.   Murmur heard. 2/6 systolic murmur right upper sternal border.  Pulmonary/Chest: Effort normal and breath sounds normal. No respiratory distress. She has no wheezes.  Lungs clear to ascultation  Musculoskeletal: Normal range of motion. She exhibits no edema.  2+ pedal pulses. No lower extremity edema  Lymphadenopathy:    She has no cervical adenopathy.  Neurological: She is alert and oriented to person, place, and time. Coordination normal.  Skin: Skin is warm and dry. She is not diaphoretic.  Psychiatric: She has a normal mood and affect. Her behavior is normal.  Nursing note and vitals reviewed.     Assessment & Plan:   1. Low back pain, unspecified back pain laterality, with sciatica presence unspecified   2. Hypothyroidism, unspecified hypothyroidism type   3. Heart murmur - since childhood per pt but I nor any other provider here noted it prior - unable to get EKG today as pt allergic to adhesive on leads - does not want them on her for even a minute as state reaction is immediate.  4. Essential hypertension, benign - cont to check at home 1x/mo  5. Polypharmacy   6.  HPL - pt not fasting today and unwilling to ever consider statin so no big point in continuing to check lipids even though they are to high Migraines - ok to refill imitrex as needed, very effective  Orders Placed This Encounter  Procedures  . Comprehensive metabolic panel  . TSH  . EKG 12-Lead    Meds ordered this encounter  Medications  . levothyroxine (SYNTHROID, LEVOTHROID) 150 MCG tablet    Sig: TAKE ONE TABLET BY MOUTH EVERY DAY    Dispense:  90 tablet    Refill:  3  . hydrochlorothiazide  (HYDRODIURIL) 25 MG tablet    Sig: Take 1 tablet (25 mg total) by mouth daily.    Dispense:  90 tablet    Refill:  3  . atenolol (TENORMIN) 25 MG tablet    Sig: Take 1 tablet (25 mg total) by mouth daily.    Dispense:  90 tablet    Refill:  3    I personally performed the services described in this documentation, which was scribed in my presence. The recorded information has been reviewed and considered, and addended by me as needed.  [name], MD MPH  Results for orders placed or performed in visit on 03/19/15  Comprehensive metabolic panel  Result Value Ref Range   Sodium 142 135 - 145 mEq/L   Potassium 4.1 3.5 - 5.3 mEq/L   Chloride 99 96 - 112 mEq/L   CO2  27 19 - 32 mEq/L   Glucose, Bld 99 70 - 99 mg/dL   BUN 22 6 - 23 mg/dL   Creat 0.68 0.50 - 1.10 mg/dL   Total Bilirubin 0.4 0.2 - 1.2 mg/dL   Alkaline Phosphatase 71 39 - 117 U/L   AST 15 0 - 37 U/L   ALT 15 0 - 35 U/L   Total Protein 7.0 6.0 - 8.3 g/dL   Albumin 4.3 3.5 - 5.2 g/dL   Calcium 9.9 8.4 - 10.5 mg/dL  TSH  Result Value Ref Range   TSH 0.996 0.350 - 4.500 uIU/mL

## 2015-03-19 NOTE — Patient Instructions (Signed)
Fat and Cholesterol Control Diet Fat and cholesterol levels in your blood and organs are influenced by your diet. High levels of fat and cholesterol may lead to diseases of the heart, small and large blood vessels, gallbladder, liver, and pancreas. CONTROLLING FAT AND CHOLESTEROL WITH DIET Although exercise and lifestyle factors are important, your diet is key. That is because certain foods are known to raise cholesterol and others to lower it. The goal is to balance foods for their effect on cholesterol and more importantly, to replace saturated and trans fat with other types of fat, such as monounsaturated fat, polyunsaturated fat, and omega-3 fatty acids. On average, a person should consume no more than 15 to 17 g of saturated fat daily. Saturated and trans fats are considered "bad" fats, and they will raise LDL cholesterol. Saturated fats are primarily found in animal products such as meats, butter, and cream. However, that does not mean you need to give up all your favorite foods. Today, there are good tasting, low-fat, low-cholesterol substitutes for most of the things you like to eat. Choose low-fat or nonfat alternatives. Choose round or loin cuts of red meat. These types of cuts are lowest in fat and cholesterol. Chicken (without the skin), fish, veal, and ground turkey breast are great choices. Eliminate fatty meats, such as hot dogs and salami. Even shellfish have little or no saturated fat. Have a 3 oz (85 g) portion when you eat lean meat, poultry, or fish. Trans fats are also called "partially hydrogenated oils." They are oils that have been scientifically manipulated so that they are solid at room temperature resulting in a longer shelf life and improved taste and texture of foods in which they are added. Trans fats are found in stick margarine, some tub margarines, cookies, crackers, and baked goods.  When baking and cooking, oils are a great substitute for butter. The monounsaturated oils are  especially beneficial since it is believed they lower LDL and raise HDL. The oils you should avoid entirely are saturated tropical oils, such as coconut and palm.  Remember to eat a lot from food groups that are naturally free of saturated and trans fat, including fish, fruit, vegetables, beans, grains (barley, rice, couscous, bulgur wheat), and pasta (without cream sauces).  IDENTIFYING FOODS THAT LOWER FAT AND CHOLESTEROL  Soluble fiber may lower your cholesterol. This type of fiber is found in fruits such as apples, vegetables such as broccoli, potatoes, and carrots, legumes such as beans, peas, and lentils, and grains such as barley. Foods fortified with plant sterols (phytosterol) may also lower cholesterol. You should eat at least 2 g per day of these foods for a cholesterol lowering effect.  Read package labels to identify low-saturated fats, trans fat free, and low-fat foods at the supermarket. Select cheeses that have only 2 to 3 g saturated fat per ounce. Use a heart-healthy tub margarine that is free of trans fats or partially hydrogenated oil. When buying baked goods (cookies, crackers), avoid partially hydrogenated oils. Breads and muffins should be made from whole grains (whole-wheat or whole oat flour, instead of "flour" or "enriched flour"). Buy non-creamy canned soups with reduced salt and no added fats.  FOOD PREPARATION TECHNIQUES  Never deep-fry. If you must fry, either stir-fry, which uses very little fat, or use non-stick cooking sprays. When possible, broil, bake, or roast meats, and steam vegetables. Instead of putting butter or margarine on vegetables, use lemon and herbs, applesauce, and cinnamon (for squash and sweet potatoes). Use nonfat   yogurt, salsa, and low-fat dressings for salads.  LOW-SATURATED FAT / LOW-FAT FOOD SUBSTITUTES Meats / Saturated Fat (g)  Avoid: Steak, marbled (3 oz/85 g) / 11 g  Choose: Steak, lean (3 oz/85 g) / 4 g  Avoid: Hamburger (3 oz/85 g) / 7  g  Choose: Hamburger, lean (3 oz/85 g) / 5 g  Avoid: Ham (3 oz/85 g) / 6 g  Choose: Ham, lean cut (3 oz/85 g) / 2.4 g  Avoid: Chicken, with skin, dark meat (3 oz/85 g) / 4 g  Choose: Chicken, skin removed, dark meat (3 oz/85 g) / 2 g  Avoid: Chicken, with skin, light meat (3 oz/85 g) / 2.5 g  Choose: Chicken, skin removed, light meat (3 oz/85 g) / 1 g Dairy / Saturated Fat (g)  Avoid: Whole milk (1 cup) / 5 g  Choose: Low-fat milk, 2% (1 cup) / 3 g  Choose: Low-fat milk, 1% (1 cup) / 1.5 g  Choose: Skim milk (1 cup) / 0.3 g  Avoid: Hard cheese (1 oz/28 g) / 6 g  Choose: Skim milk cheese (1 oz/28 g) / 2 to 3 g  Avoid: Cottage cheese, 4% fat (1 cup) / 6.5 g  Choose: Low-fat cottage cheese, 1% fat (1 cup) / 1.5 g  Avoid: Ice cream (1 cup) / 9 g  Choose: Sherbet (1 cup) / 2.5 g  Choose: Nonfat frozen yogurt (1 cup) / 0.3 g  Choose: Frozen fruit bar / trace  Avoid: Whipped cream (1 tbs) / 3.5 g  Choose: Nondairy whipped topping (1 tbs) / 1 g Condiments / Saturated Fat (g)  Avoid: Mayonnaise (1 tbs) / 2 g  Choose: Low-fat mayonnaise (1 tbs) / 1 g  Avoid: Butter (1 tbs) / 7 g  Choose: Extra light margarine (1 tbs) / 1 g  Avoid: Coconut oil (1 tbs) / 11.8 g  Choose: Olive oil (1 tbs) / 1.8 g  Choose: Corn oil (1 tbs) / 1.7 g  Choose: Safflower oil (1 tbs) / 1.2 g  Choose: Sunflower oil (1 tbs) / 1.4 g  Choose: Soybean oil (1 tbs) / 2.4 g  Choose: Canola oil (1 tbs) / 1 g Document Released: 08/30/2005 Document Revised: 12/25/2012 Document Reviewed: 11/28/2013 Exit[employer] Patient Information 2015 [employer]. This information is not intended to replace advice given to you by your health care provider. Make sure you discuss any questions you have with your health care provider.  

## 2015-03-20 ENCOUNTER — Encounter: Payer: Self-pay | Admitting: Family Medicine

## 2015-11-05 ENCOUNTER — Ambulatory Visit (INDEPENDENT_AMBULATORY_CARE_PROVIDER_SITE_OTHER): Payer: 59 | Admitting: Family Medicine

## 2015-11-05 VITALS — BP 122/84 | HR 58 | Temp 99.0°F | Resp 16 | Ht 65.5 in | Wt 246.0 lb

## 2015-11-05 DIAGNOSIS — J209 Acute bronchitis, unspecified: Secondary | ICD-10-CM | POA: Diagnosis not present

## 2015-11-05 DIAGNOSIS — Z5181 Encounter for therapeutic drug level monitoring: Secondary | ICD-10-CM | POA: Diagnosis not present

## 2015-11-05 DIAGNOSIS — E039 Hypothyroidism, unspecified: Secondary | ICD-10-CM

## 2015-11-05 LAB — CBC WITH DIFFERENTIAL/PLATELET
Basophils Absolute: 0.1 10*3/uL (ref 0.0–0.1)
Basophils Relative: 1 % (ref 0–1)
EOS PCT: 3 % (ref 0–5)
Eosinophils Absolute: 0.2 10*3/uL (ref 0.0–0.7)
HEMATOCRIT: 45.1 % (ref 36.0–46.0)
Hemoglobin: 15.3 g/dL — ABNORMAL HIGH (ref 12.0–15.0)
LYMPHS ABS: 2.3 10*3/uL (ref 0.7–4.0)
LYMPHS PCT: 30 % (ref 12–46)
MCH: 29.8 pg (ref 26.0–34.0)
MCHC: 33.9 g/dL (ref 30.0–36.0)
MCV: 87.9 fL (ref 78.0–100.0)
MONO ABS: 0.8 10*3/uL (ref 0.1–1.0)
MPV: 11.3 fL (ref 8.6–12.4)
Monocytes Relative: 10 % (ref 3–12)
Neutro Abs: 4.3 10*3/uL (ref 1.7–7.7)
Neutrophils Relative %: 56 % (ref 43–77)
Platelets: 259 10*3/uL (ref 150–400)
RBC: 5.13 MIL/uL — ABNORMAL HIGH (ref 3.87–5.11)
RDW: 12.6 % (ref 11.5–15.5)
WBC: 7.6 10*3/uL (ref 4.0–10.5)

## 2015-11-05 MED ORDER — AZITHROMYCIN 250 MG PO TABS: ORAL_TABLET | ORAL | Status: DC

## 2015-11-05 MED ORDER — IPRATROPIUM BROMIDE 0.02 % IN SOLN
0.5000 mg | Freq: Once | RESPIRATORY_TRACT | Status: AC
Start: 2015-11-05 — End: 2015-11-05
  Administered 2015-11-05: 0.5 mg via RESPIRATORY_TRACT

## 2015-11-05 MED ORDER — GUAIFENESIN ER 1200 MG PO TB12: 1.0000 | ORAL_TABLET | Freq: Two times a day (BID) | ORAL | Status: DC | PRN

## 2015-11-05 MED ORDER — ALBUTEROL SULFATE (2.5 MG/3ML) 0.083% IN NEBU
2.5000 mg | INHALATION_SOLUTION | Freq: Once | RESPIRATORY_TRACT | Status: AC
  Administered 2015-11-05: 2.5 mg via RESPIRATORY_TRACT

## 2015-11-05 NOTE — Patient Instructions (Signed)

## 2015-11-05 NOTE — Progress Notes (Signed)
Subjective:    Patient ID: Crystal Garner, female    DOB: August 30, 1956, 60 y.o.   MRN: [id number]   Chief Complaint  Patient presents with  . URI    chest congestion, pt feels like her chest is burning/ x sunday    HPI  Felt weird on Sat - felt tired, grumpy, and didn't sleep well.  Her neighbor kids are sick.  She denies she is having any low grade, is not breathing well., not coughing much but o/n and occ productive.  No otc cough/cold meds  Partner is Corporate treasurer - a pt is mine - is set up for a FNA of her thyroid biopsy  Has been noticing really dry skin and neails for past few months  Poor sleep  Past Medical History  Diagnosis Date  . Depression   . Thyroid disease   . Hypertension    Past Surgical History  Procedure Laterality Date  . Lumbar epidural injection     Current Outpatient Prescriptions on File Prior to Visit  Medication Sig Dispense Refill  . atenolol (TENORMIN) 25 MG tablet Take 1 tablet (25 mg total) by mouth daily. 90 tablet 3  . cholecalciferol (VITAMIN D) 1000 UNITS tablet Take 1,000 Units by mouth 3 (three) times a week.    . hydrochlorothiazide (HYDRODIURIL) 25 MG tablet Take 1 tablet (25 mg total) by mouth daily. 90 tablet 3  . levothyroxine (SYNTHROID, LEVOTHROID) 150 MCG tablet TAKE ONE TABLET BY MOUTH EVERY DAY 90 tablet 3  . SUMAtriptan (IMITREX) 100 MG tablet TAKE ONE TABLET BY MOUTH EVERY 2 HOURS AS NEEDED FOR MIGRAINE (Patient not taking: Reported on 11/05/2015) 10 tablet 11   No current facility-administered medications on file prior to visit.   Allergies  Allergen Reactions  . Shellfish Allergy Anaphylaxis and Rash  . Amoxicillin   . Codeine   . Penicillins    Family History  Problem Relation Age of Onset  . Alzheimer's disease Mother   . Diabetes Father   . Arthritis Father   . Multiple sclerosis Sister   . Heart disease Brother   . Diabetes Maternal Grandmother   . Heart disease Maternal Grandmother   . Heart disease Maternal  Grandfather    Social History   Social History  . Marital Status: Significant Other    Spouse Name: N/A  . Number of Children: N/A  . Years of Education: N/A   Social History Main Topics  . Smoking status: Former Research scientist (life sciences)  . Smokeless tobacco: None  . Alcohol Use: 0.0 oz/week    3-4 Glasses of wine per week  . Drug Use: No  . Sexual Activity:    Partners: Female    Birth Control/ Protection: Post-menopausal   Other Topics Concern  . None   Social History Narrative     Review of Systems  Constitutional: Positive for activity change and fatigue. Negative for fever, chills and diaphoresis.  HENT: Negative for congestion, rhinorrhea and sore throat.   Respiratory: Positive for cough, chest tightness and shortness of breath. Negative for wheezing.   Cardiovascular: Negative for chest pain, palpitations and leg swelling.  Musculoskeletal: Negative for back pain and arthralgias.  Skin: Positive for rash.  Hematological: Negative for adenopathy.  Psychiatric/Behavioral: Positive for sleep disturbance, dysphoric mood and decreased concentration.       Objective:  BP 122/84 mmHg  Pulse 58  Temp(Src) 99 F (37.2 C) (Oral)  Resp 16  Ht 5' 5.5" (1.664 m)  Wt 246 lb (111.585 kg)  BMI 40.30 kg/m2  SpO2 97%  Physical Exam  Constitutional: She is oriented to person, place, and time. She appears well-developed and well-nourished. No distress.  HENT:  Head: Normocephalic and atraumatic.  Right Ear: Tympanic membrane, external ear and ear canal normal.  Left Ear: Tympanic membrane, external ear and ear canal normal.  Nose: Nose normal. No mucosal edema or rhinorrhea.  Mouth/Throat: Uvula is midline, oropharynx is clear and moist and mucous membranes are normal. No oropharyngeal exudate.  Eyes: Conjunctivae are normal. Right eye exhibits no discharge. Left eye exhibits no discharge. No scleral icterus.  Neck: Normal range of motion. Neck supple.  Cardiovascular: Normal rate,  regular rhythm, normal heart sounds and intact distal pulses.   Pulmonary/Chest: Effort normal and breath sounds normal.  Lymphadenopathy:    She has no cervical adenopathy.  Neurological: She is alert and oriented to person, place, and time.  Skin: Skin is warm and dry. She is not diaphoretic. No erythema.  Psychiatric: She has a normal mood and affect. Her behavior is normal.   Peak flow was 390; predicted 410, s/p duoneb unchanged    Assessment & Plan:   1. Hypothyroidism, unspecified hypothyroidism type - decrease levothyroxine to 137 form 150 as tsh today suppressed. Recheck in 6-8 wks - future orders entered  2. Acute bronchitis, unspecified organism - snap rx gave for zpack in case sxs do not improve  3. Medication monitoring encounter     Orders Placed This Encounter  Procedures  . CBC with Differential/Platelet  . Comprehensive metabolic panel  . Thyroid Panel With TSH  . Thyroid Panel With TSH    Standing Status: Future     Number of Occurrences:      Standing Expiration Date: 11/05/2016    Meds ordered this encounter  Medications  . albuterol (PROVENTIL) (2.5 MG/3ML) 0.083% nebulizer solution 2.5 mg    Sig:   . ipratropium (ATROVENT) nebulizer solution 0.5 mg    Sig:   . azithromycin (ZITHROMAX) 250 MG tablet    Sig: Take 2 tabs PO x 1 dose, then 1 tab PO QD x 4 days    Dispense:  6 tablet    Refill:  0  . Guaifenesin (MUCINEX MAXIMUM STRENGTH) 1200 MG TB12    Sig: Take 1 tablet (1,200 mg total) by mouth every 12 (twelve) hours as needed.    Dispense:  14 tablet    Refill:  1  . levothyroxine (SYNTHROID, LEVOTHROID) 137 MCG tablet    Sig: Take 1 tablet (137 mcg total) by mouth daily before breakfast.    Dispense:  90 tablet    Refill:  0      [name], MD MPH

## 2015-11-06 ENCOUNTER — Encounter: Payer: Self-pay | Admitting: Family Medicine

## 2015-11-06 LAB — COMPREHENSIVE METABOLIC PANEL
ALK PHOS: 62 U/L (ref 33–130)
ALT: 16 U/L (ref 6–29)
AST: 18 U/L (ref 10–35)
Albumin: 4.3 g/dL (ref 3.6–5.1)
BUN: 16 mg/dL (ref 7–25)
CALCIUM: 10.4 mg/dL (ref 8.6–10.4)
CO2: 22 mmol/L (ref 20–31)
Chloride: 100 mmol/L (ref 98–110)
Creat: 0.8 mg/dL (ref 0.50–0.99)
GLUCOSE: 111 mg/dL — AB (ref 65–99)
POTASSIUM: 4 mmol/L (ref 3.5–5.3)
Sodium: 139 mmol/L (ref 135–146)
Total Bilirubin: 0.4 mg/dL (ref 0.2–1.2)
Total Protein: 7.4 g/dL (ref 6.1–8.1)

## 2015-11-06 LAB — THYROID PANEL WITH TSH
Free Thyroxine Index: 5.3 — ABNORMAL HIGH (ref 1.4–3.8)
T3 UPTAKE: 32 % (ref 22–35)
T4 TOTAL: 16.5 ug/dL — AB (ref 4.5–12.0)
TSH: 2.11 m[IU]/L

## 2015-11-06 MED ORDER — LEVOTHYROXINE SODIUM 137 MCG PO TABS: 137.0000 ug | ORAL_TABLET | Freq: Every day | ORAL | Status: DC

## 2015-12-03 ENCOUNTER — Other Ambulatory Visit: Payer: Self-pay | Admitting: Family Medicine

## 2015-12-05 NOTE — Telephone Encounter (Signed)
Dr Brigitte Pulse, you just saw pt with Dx Medication monitoring, but I don't see migraines or this med discussed. Do you want to give RFs?

## 2016-01-23 ENCOUNTER — Other Ambulatory Visit: Payer: Self-pay | Admitting: Family Medicine

## 2016-04-04 ENCOUNTER — Other Ambulatory Visit: Payer: Self-pay | Admitting: Family Medicine

## 2016-04-20 ENCOUNTER — Other Ambulatory Visit: Payer: Self-pay | Admitting: Family Medicine

## 2016-04-29 ENCOUNTER — Ambulatory Visit (INDEPENDENT_AMBULATORY_CARE_PROVIDER_SITE_OTHER): Payer: 59 | Admitting: Family Medicine

## 2016-04-29 DIAGNOSIS — E538 Deficiency of other specified B group vitamins: Secondary | ICD-10-CM

## 2016-04-29 MED ORDER — CYANOCOBALAMIN 1000 MCG/ML IJ SOLN
1000.0000 ug | Freq: Once | INTRAMUSCULAR | Status: AC
  Administered 2016-04-29: 1000 ug via INTRAMUSCULAR

## 2016-04-29 MED ORDER — ATENOLOL 50 MG PO TABS: 25.0000 mg | ORAL_TABLET | Freq: Every day | ORAL | 3 refills | Status: DC

## 2016-04-30 NOTE — Progress Notes (Signed)
Patient ID: Crystal Garner, female   DOB: 10/10/1955, 60 y.o.   MRN: [id number] Pt's pharmacy is out of atenolol 25 - needs to cut a 50 in half but pharm wants new rx for this. Has a h/o vit B12 def and receivied monthly IM replacement which she responded well to and wants to retry today. Is pescetarian and working on eating healthier.

## 2016-06-06 ENCOUNTER — Other Ambulatory Visit: Payer: Self-pay | Admitting: Physician Assistant

## 2016-06-08 NOTE — Telephone Encounter (Signed)
Pt needed OV prior to further refills of Hydrodiuril - Pt seen 04/29/2016 - no notes about refills for this med. Does pt need to return? Please advise

## 2016-06-11 MED ORDER — HYDROCHLOROTHIAZIDE 25 MG PO TABS: 25.0000 mg | ORAL_TABLET | Freq: Every day | ORAL | 1 refills | Status: DC

## 2016-06-11 NOTE — Addendum Note (Signed)
Addended by: [name] on: 06/11/2016 08:52 AM   Modules accepted: Orders

## 2016-06-11 NOTE — Telephone Encounter (Signed)
Recheck within the next 6 mos.

## 2016-07-18 ENCOUNTER — Other Ambulatory Visit: Payer: Self-pay | Admitting: Physician Assistant

## 2016-07-23 ENCOUNTER — Other Ambulatory Visit: Payer: Self-pay

## 2016-07-23 MED ORDER — LEVOTHYROXINE SODIUM 137 MCG PO TABS: ORAL_TABLET | ORAL | 0 refills | Status: DC

## 2016-07-23 NOTE — Telephone Encounter (Signed)
Spoke with Dr. Brigitte Pulse, she refilled x 90 days with note to return for follow up prior to running out.  Spoke with pharmacist & sent electronically.

## 2016-07-28 ENCOUNTER — Ambulatory Visit (INDEPENDENT_AMBULATORY_CARE_PROVIDER_SITE_OTHER): Payer: 59 | Admitting: Family Medicine

## 2016-07-28 VITALS — BP 128/80 | HR 54 | Temp 98.5°F | Resp 18 | Ht 65.0 in | Wt 250.6 lb

## 2016-07-28 DIAGNOSIS — E039 Hypothyroidism, unspecified: Secondary | ICD-10-CM

## 2016-07-28 DIAGNOSIS — I10 Essential (primary) hypertension: Secondary | ICD-10-CM | POA: Diagnosis not present

## 2016-07-28 DIAGNOSIS — E785 Hyperlipidemia, unspecified: Secondary | ICD-10-CM | POA: Diagnosis not present

## 2016-07-28 DIAGNOSIS — E538 Deficiency of other specified B group vitamins: Secondary | ICD-10-CM | POA: Diagnosis not present

## 2016-07-28 MED ORDER — RIZATRIPTAN BENZOATE 5 MG PO TABS: 5.0000 mg | ORAL_TABLET | ORAL | 1 refills | Status: DC | PRN

## 2016-07-28 MED ORDER — CYANOCOBALAMIN 1000 MCG/ML IJ SOLN
1000.0000 ug | INTRAMUSCULAR | Status: AC
  Administered 2016-07-28: 1000 ug via INTRAMUSCULAR

## 2016-07-28 NOTE — Progress Notes (Signed)
Subjective:  By signing my name below, I, Raven Small, attest that this documentation has been prepared under the direction and in the presence of [name], MD.  Electronically Signed: Thea Alken, ED Scribe. 07/28/2016. 2:42 PM.   Patient ID: Crystal Garner, female    DOB: [birth year], 60 y.o.   MRN: [id number]  HPI Chief Complaint  Patient presents with  . Follow-up    to recheck TSH    HPI Comments: Crystal Garner is a 60 y.o. female who presents to the [facility] for a recheck. She is on levothyroxine 137 mcg. Last TSH 10/2015, at 2.11. She check BP occasionally, last check about 2 weeks ago; reading was about the same as today's, 128/80. She takes half tablet of atenolol 50 mg. Pt also takes a sublingual vitamin B12 daily. She would like an b12 injection today which has helped with energy levels. Pt does not exercise regularly but does enjoy walking at the mall. She rarely develops migraines.   She got flu shot about 3 weeks ago.   Patient Active Problem List   Diagnosis Date Noted  . Other and unspecified hyperlipidemia 01/11/2014  . Essential hypertension, benign 08/17/2013  . LBP (low back pain) 08/07/2012  . Hypothyroid 08/07/2012  . Former smoker 08/07/2012   Past Medical History:  Diagnosis Date  . Depression   . Hypertension   . Thyroid disease    Past Surgical History:  Procedure Laterality Date  . LUMBAR EPIDURAL INJECTION     Allergies  Allergen Reactions  . Shellfish Allergy Anaphylaxis and Rash  . Amoxicillin   . Codeine   . Penicillins    Prior to Admission medications   Medication Sig Start Date End Date Taking? Authorizing Provider  atenolol (TENORMIN) 50 MG tablet Take 0.5 tablets (25 mg total) by mouth daily. 04/29/16  Yes Shawnee Knapp, MD  cholecalciferol (VITAMIN D) 1000 UNITS tablet Take 1,000 Units by mouth 3 (three) times a week.   Yes Historical Provider, MD  hydrochlorothiazide (HYDRODIURIL) 25 MG tablet Take 1 tablet (25 mg  total) by mouth daily. 06/11/16  Yes Shawnee Knapp, MD  levothyroxine (SYNTHROID, LEVOTHROID) 137 MCG tablet TAKE ONE TABLET BY MOUTH ONCE DAILY BEFORE BREAKFAST 07/23/16  Yes Shawnee Knapp, MD  SUMAtriptan (IMITREX) 100 MG tablet TAKE ONE TABLET BY MOUTH EVERY 2 HOURS AS NEEDED FOR  MIGRAINE 12/06/15  Yes Shawnee Knapp, MD   Social History   Social History  . Marital status: Significant Other    Spouse name: N/A  . Number of children: N/A  . Years of education: N/A   Occupational History  . Not on file.   Social History Main Topics  . Smoking status: Former Research scientist (life sciences)  . Smokeless tobacco: Not on file  . Alcohol use 0.0 oz/week    3 - 4 Glasses of wine per week  . Drug use: No  . Sexual activity: Yes    Partners: Female    Birth control/ protection: Post-menopausal   Other Topics Concern  . Not on file   Social History Narrative  . No narrative on file   Review of Systems See hpi    Objective:   Physical Exam  Constitutional: She is oriented to person, place, and time. She appears well-developed and well-nourished. No distress.  HENT:  Head: Normocephalic and atraumatic.  Eyes: Conjunctivae and EOM are normal.  Neck: Neck supple. No tracheal deviation present.  Cardiovascular: Normal rate, regular rhythm and normal  heart sounds.   Pulmonary/Chest: Effort normal and breath sounds normal. No respiratory distress.  Musculoskeletal: Normal range of motion.  Neurological: She is alert and oriented to person, place, and time.  Skin: Skin is warm and dry.  Psychiatric: She has a normal mood and affect. Her behavior is normal.  Nursing note and vitals reviewed.   Vitals:   07/28/16 1429  BP: 128/80  Pulse: (!) 54  Resp: 18  Temp: 98.5 F (36.9 C)  TempSrc: Oral  SpO2: 96%  Weight: 250 lb 9.6 oz (113.7 kg)  Height: 5\' 5"  (1.651 m)    Assessment & Plan:   1. Essential hypertension, benign   2. Vitamin B12 deficiency   3. Hypothyroidism, unspecified type   4. Hyperlipidemia,  unspecified hyperlipidemia type     Orders Placed This Encounter  Procedures  . Comprehensive metabolic panel    Standing Status:   Future    Standing Expiration Date:   07/28/2017    Order Specific Question:   Has the patient fasted?    Answer:   Yes  . Vitamin B12    Standing Status:   Future    Standing Expiration Date:   07/28/2017  . Lipid panel    Standing Status:   Future    Standing Expiration Date:   07/28/2017    Order Specific Question:   Has the patient fasted?    Answer:   Yes  . Thyroid Panel With TSH    Standing Status:   Future    Standing Expiration Date:   07/28/2017    Meds ordered this encounter  Medications  . cyanocobalamin ((VITAMIN B-12)) injection 1,000 mcg  . rizatriptan (MAXALT) 5 MG tablet    Sig: Take 1 tablet (5 mg total) by mouth as needed for migraine. May repeat in 2 hours if needed    Dispense:  10 tablet    Refill:  1    I personally performed the services described in this documentation, which was scribed in my presence. The recorded information has been reviewed and considered, and addended by me as needed.   [name], M.D.  [facility] [address] [place], [place] [address] [phone] phone [phone] fax  08/12/16 10:58 PM

## 2016-07-28 NOTE — Patient Instructions (Signed)
     IF you received an x-ray today, you will receive an invoice from [facility]. Please contact [facility] at [phone] with questions or concerns regarding your invoice.   IF you received labwork today, you will receive an invoice from [facility]. Please contact [facility] at [phone] with questions or concerns regarding your invoice.   Our billing staff will not be able to assist you with questions regarding bills from these companies.  You will be contacted with the lab results as soon as they are available. The fastest way to get your results is to activate your My Chart account. Instructions are located on the last page of this paperwork. If you have not heard from us regarding the results in 2 weeks, please contact this office.      

## 2016-08-24 ENCOUNTER — Telehealth: Payer: Self-pay

## 2016-08-24 NOTE — Telephone Encounter (Signed)
[employer] use to be the manufacture ot levothyroxine (SYNTHROID, LEVOTHROID) 137 MCG tablet [id number], now [employer] Is the manufactore and Darie needs our  Doctor's approval to get script filled from new manufacture. Please advise at [phone]

## 2016-08-25 ENCOUNTER — Other Ambulatory Visit: Payer: Self-pay

## 2016-08-25 MED ORDER — LEVOTHYROXINE SODIUM 137 MCG PO TABS: ORAL_TABLET | ORAL | 3 refills | Status: DC

## 2016-08-25 NOTE — Telephone Encounter (Signed)
Fax request [facility] [address] Levothyroxin 135mcg permission to change brands Sent

## 2016-08-27 NOTE — Telephone Encounter (Signed)
Pharmacy needs approval to fill the synthroid because it's a new manufacturer making the pills.

## 2016-08-27 NOTE — Telephone Encounter (Signed)
Done, see notes under RF req enc.

## 2016-09-01 ENCOUNTER — Other Ambulatory Visit: Payer: 59

## 2016-09-01 DIAGNOSIS — E039 Hypothyroidism, unspecified: Secondary | ICD-10-CM

## 2016-09-01 DIAGNOSIS — E785 Hyperlipidemia, unspecified: Secondary | ICD-10-CM

## 2016-09-01 DIAGNOSIS — E538 Deficiency of other specified B group vitamins: Secondary | ICD-10-CM

## 2016-09-01 DIAGNOSIS — I10 Essential (primary) hypertension: Secondary | ICD-10-CM

## 2016-09-02 LAB — COMPREHENSIVE METABOLIC PANEL
ALK PHOS: 62 IU/L (ref 39–117)
ALT: 15 IU/L (ref 0–32)
AST: 15 IU/L (ref 0–40)
Albumin/Globulin Ratio: 1.6 (ref 1.2–2.2)
Albumin: 4.2 g/dL (ref 3.6–4.8)
BUN/Creatinine Ratio: 19 (ref 12–28)
BUN: 15 mg/dL (ref 8–27)
Bilirubin Total: 0.4 mg/dL (ref 0.0–1.2)
CALCIUM: 10 mg/dL (ref 8.7–10.3)
CO2: 25 mmol/L (ref 18–29)
CREATININE: 0.79 mg/dL (ref 0.57–1.00)
Chloride: 100 mmol/L (ref 96–106)
GFR calc Af Amer: 93 mL/min/{1.73_m2} (ref >59)
GFR, EST NON AFRICAN AMERICAN: 81 mL/min/{1.73_m2} (ref >59)
GLOBULIN, TOTAL: 2.6 g/dL (ref 1.5–4.5)
GLUCOSE: 116 mg/dL — AB (ref 65–99)
Potassium: 4.3 mmol/L (ref 3.5–5.2)
SODIUM: 143 mmol/L (ref 134–144)
Total Protein: 6.8 g/dL (ref 6.0–8.5)

## 2016-09-02 LAB — LIPID PANEL
CHOL/HDL RATIO: 5.3 ratio — AB (ref 0.0–4.4)
Cholesterol, Total: 284 mg/dL — ABNORMAL HIGH (ref 100–199)
HDL: 54 mg/dL (ref >39)
LDL CALC: 173 mg/dL — AB (ref 0–99)
Triglycerides: 284 mg/dL — ABNORMAL HIGH (ref 0–149)
VLDL Cholesterol Cal: 57 mg/dL — ABNORMAL HIGH (ref 5–40)

## 2016-09-02 LAB — VITAMIN B12: VITAMIN B 12: 950 pg/mL (ref 232–1245)

## 2016-09-02 LAB — THYROID PANEL WITH TSH
Free Thyroxine Index: 3.5 (ref 1.2–4.9)
T3 UPTAKE RATIO: 31 % (ref 24–39)
T4 TOTAL: 11.3 ug/dL (ref 4.5–12.0)
TSH: 2.68 u[IU]/mL (ref 0.450–4.500)

## 2016-11-03 ENCOUNTER — Ambulatory Visit: Payer: 59

## 2016-11-19 ENCOUNTER — Other Ambulatory Visit: Payer: Self-pay | Admitting: Family Medicine

## 2016-11-19 DIAGNOSIS — Z1231 Encounter for screening mammogram for malignant neoplasm of breast: Secondary | ICD-10-CM

## 2017-01-03 ENCOUNTER — Other Ambulatory Visit: Payer: Self-pay | Admitting: Family Medicine

## 2017-01-03 ENCOUNTER — Ambulatory Visit
Admission: RE | Admit: 2017-01-03 | Discharge: 2017-01-03 | Disposition: A | Discharge status: Home / Self Care | Payer: 59 | Source: Ambulatory Visit | Attending: Family Medicine | Admitting: Family Medicine

## 2017-01-03 DIAGNOSIS — Z1231 Encounter for screening mammogram for malignant neoplasm of breast: Secondary | ICD-10-CM

## 2017-01-10 ENCOUNTER — Other Ambulatory Visit: Payer: Self-pay | Admitting: Family Medicine

## 2017-01-10 DIAGNOSIS — R928 Other abnormal and inconclusive findings on diagnostic imaging of breast: Secondary | ICD-10-CM

## 2017-01-14 ENCOUNTER — Other Ambulatory Visit: Payer: Self-pay | Admitting: Family Medicine

## 2017-01-14 ENCOUNTER — Ambulatory Visit
Admission: RE | Admit: 2017-01-14 | Discharge: 2017-01-14 | Disposition: A | Discharge status: Home / Self Care | Payer: 59 | Source: Ambulatory Visit | Attending: Family Medicine | Admitting: Family Medicine

## 2017-01-14 DIAGNOSIS — R928 Other abnormal and inconclusive findings on diagnostic imaging of breast: Secondary | ICD-10-CM

## 2017-01-14 DIAGNOSIS — N6310 Unspecified lump in the right breast, unspecified quadrant: Secondary | ICD-10-CM

## 2017-01-17 ENCOUNTER — Other Ambulatory Visit: Payer: Self-pay | Admitting: Family Medicine

## 2017-01-17 ENCOUNTER — Ambulatory Visit
Admission: RE | Admit: 2017-01-17 | Discharge: 2017-01-17 | Disposition: A | Discharge status: Home / Self Care | Payer: 59 | Source: Ambulatory Visit | Attending: Family Medicine | Admitting: Family Medicine

## 2017-01-17 DIAGNOSIS — N6310 Unspecified lump in the right breast, unspecified quadrant: Secondary | ICD-10-CM

## 2017-02-09 ENCOUNTER — Ambulatory Visit (INDEPENDENT_AMBULATORY_CARE_PROVIDER_SITE_OTHER): Payer: 59

## 2017-02-09 ENCOUNTER — Ambulatory Visit (INDEPENDENT_AMBULATORY_CARE_PROVIDER_SITE_OTHER): Payer: 59 | Admitting: Podiatry

## 2017-02-09 VITALS — BP 131/75 | HR 59

## 2017-02-09 DIAGNOSIS — L600 Ingrowing nail: Secondary | ICD-10-CM | POA: Diagnosis not present

## 2017-02-09 DIAGNOSIS — D169 Benign neoplasm of bone and articular cartilage, unspecified: Secondary | ICD-10-CM

## 2017-02-09 DIAGNOSIS — M79672 Pain in left foot: Secondary | ICD-10-CM

## 2017-02-09 DIAGNOSIS — L84 Corns and callosities: Secondary | ICD-10-CM

## 2017-02-09 NOTE — Progress Notes (Signed)
Subjective:    Patient ID: Crystal Garner, female   DOB: 61 y.o.   MRN: [id number]   HPI patient presents stating she has a painful big toenail left on both sides and the right hallux nail was removed and is thick and can become irritated with tissue formation and tissue underneath the fifth metatarsal left along with a spur on top of the left foot    Review of Systems  All other systems reviewed and are negative.       Objective:  Physical Exam  Cardiovascular: Intact distal pulses.   Musculoskeletal: Normal range of motion.  Neurological: She is alert.  Skin: Skin is warm.  Nursing note and vitals reviewed.  neurovascular status intact muscle strength was adequate range of motion was found to be within normal limits with patient found to have incurvated left hallux nail medial lateral borders and moderate damage to the right hallux nail with a thin nail with more distal keratotic tissue with keratotic tissue sub-fifth metatarsal left and a prominence on the left dorsal midfoot area     Assessment:   Ingrown toenail deformity left medial lateral borders with damaged right hallux nail keratotic tissue formation and probable bone spur dorsum left foot      Plan:    H&P and all conditions reviewed and recommended correction of the ingrown toenail along with debridement of lesion formation. I infiltrated the left hallux 60 Milligan times like Marcaine mixture and remove the medial and lateral borders exposing matrix and applying phenol 3 applications 30 seconds to each border followed by alcohol lavaged sterile dressing. I then debrided tissue formation on the right and left foot and recommended wider-type shoes for dorsum left with possibility for bone spur resection if symptoms were to persist  X-ray report indicated that there is some irritation of dorsal bone with probable small spur formation

## 2017-02-09 NOTE — Patient Instructions (Signed)

## 2017-02-09 NOTE — Progress Notes (Signed)
   Subjective:    Patient ID: Crystal Garner, female    DOB: 1955-11-16, 61 y.o.   MRN: [id number]  HPI    Review of Systems  Musculoskeletal: Positive for back pain and gait problem.  All other systems reviewed and are negative.      Objective:   Physical Exam        Assessment & Plan:

## 2017-04-19 ENCOUNTER — Other Ambulatory Visit: Payer: Self-pay | Admitting: Family Medicine

## 2017-04-26 ENCOUNTER — Encounter: Payer: Self-pay | Admitting: Family Medicine

## 2017-04-26 ENCOUNTER — Ambulatory Visit (INDEPENDENT_AMBULATORY_CARE_PROVIDER_SITE_OTHER): Payer: 59 | Admitting: Family Medicine

## 2017-04-26 VITALS — BP 122/70 | HR 56 | Temp 97.8°F | Resp 18 | Ht 65.0 in | Wt 238.0 lb

## 2017-04-26 DIAGNOSIS — E66813 Obesity, class 3: Secondary | ICD-10-CM

## 2017-04-26 DIAGNOSIS — E039 Hypothyroidism, unspecified: Secondary | ICD-10-CM | POA: Diagnosis not present

## 2017-04-26 DIAGNOSIS — E538 Deficiency of other specified B group vitamins: Secondary | ICD-10-CM | POA: Insufficient documentation

## 2017-04-26 DIAGNOSIS — R399 Unspecified symptoms and signs involving the genitourinary system: Secondary | ICD-10-CM | POA: Diagnosis not present

## 2017-04-26 DIAGNOSIS — Z5181 Encounter for therapeutic drug level monitoring: Secondary | ICD-10-CM

## 2017-04-26 DIAGNOSIS — E785 Hyperlipidemia, unspecified: Secondary | ICD-10-CM

## 2017-04-26 DIAGNOSIS — I10 Essential (primary) hypertension: Secondary | ICD-10-CM

## 2017-04-26 DIAGNOSIS — R7301 Impaired fasting glucose: Secondary | ICD-10-CM | POA: Diagnosis not present

## 2017-04-26 DIAGNOSIS — Z6841 Body Mass Index (BMI) 40.0 and over, adult: Secondary | ICD-10-CM | POA: Diagnosis not present

## 2017-04-26 LAB — POCT URINALYSIS DIP (MANUAL ENTRY)
BILIRUBIN UA: NEGATIVE
BILIRUBIN UA: NEGATIVE mg/dL
Glucose, UA: NEGATIVE mg/dL
LEUKOCYTES UA: NEGATIVE
Nitrite, UA: NEGATIVE
PH UA: 7 (ref 5.0–8.0)
PROTEIN UA: NEGATIVE mg/dL
SPEC GRAV UA: 1.015 (ref 1.010–1.025)
Urobilinogen, UA: 0.2 E.U./dL

## 2017-04-26 MED ORDER — ATENOLOL 50 MG PO TABS: 25.0000 mg | ORAL_TABLET | Freq: Every day | ORAL | 3 refills | Status: AC

## 2017-04-26 MED ORDER — HYDROCHLOROTHIAZIDE 25 MG PO TABS: 25.0000 mg | ORAL_TABLET | Freq: Every day | ORAL | 1 refills | Status: DC

## 2017-04-26 NOTE — Patient Instructions (Addendum)
IF you received an x-ray today, you will receive an invoice from [facility]. Please contact [facility] at [phone] with questions or concerns regarding your invoice.   IF you received labwork today, you will receive an invoice from [facility]. Please contact [facility] at [phone] with questions or concerns regarding your invoice.   Our billing staff will not be able to assist you with questions regarding bills from these companies.  You will be contacted with the lab results as soon as they are available. The fastest way to get your results is to activate your My Chart account. Instructions are located on the last page of this paperwork. If you have not heard from [place] regarding the results in 2 weeks, please contact this office.     Levothyroxine tablets What is this medicine? LEVOTHYROXINE (lee voe thye ROX een) is a thyroid hormone. This medicine can improve symptoms of thyroid deficiency such as slow speech, lack of energy, weight gain, hair loss, dry skin, and feeling cold. It also helps to treat goiter (an enlarged thyroid gland). It is also used to treat some kinds of thyroid cancer along with surgery and other medicines. This medicine may be used for other purposes; ask your health care provider or pharmacist if you have questions. COMMON BRAND NAME(S): Estre, Levo-T, Levothroid, Levoxyl, Synthroid, Thyro-Tabs, Unithroid What should I tell my health care provider before I take this medicine? They need to know if you have any of these conditions: -angina -blood clotting problems -diabetes -dieting or on a weight loss program -fertility problems -heart disease -high levels of thyroid hormone -pituitary gland problem -previous heart attack -an unusual or allergic reaction to levothyroxine, thyroid hormones, other medicines, foods, dyes, or preservatives -pregnant or trying to get pregnant -breast-feeding How should I use this medicine? Take this  medicine by mouth with plenty of water. It is best to take on an empty stomach, at least 30 minutes before or 2 hours after food. Follow the directions on the prescription label. Take at the same time each day. Do not take your medicine more often than directed. Contact your pediatrician regarding the use of this medicine in children. While this drug may be prescribed for children and infants as young as a few days of age for selected conditions, precautions do apply. For infants, you may crush the tablet and place in a small amount of (5-10 ml or 1 to 2 teaspoonfuls) of water, breast milk, or non-soy based infant formula. Do not mix with soy-based infant formula. Give as directed. Overdosage: If you think you have taken too much of this medicine contact a poison control center or emergency room at once. NOTE: This medicine is only for you. Do not share this medicine with others. What if I miss a dose? If you miss a dose, take it as soon as you can. If it is almost time for your next dose, take only that dose. Do not take double or extra doses. What may interact with this medicine? -amiodarone -antacids -anti-thyroid medicines -calcium supplements -carbamazepine -cholestyramine -colestipol -digoxin -female hormones, including contraceptive or birth control pills -iron supplements -ketamine -liquid nutrition products like Ensure -medicines for colds and breathing difficulties -medicines for diabetes -medicines for mental depression -medicines or herbals used to decrease weight or appetite -phenobarbital or other barbiturate medications -phenytoin -prednisone or other corticosteroids -rifabutin -rifampin -soy isoflavones -sucralfate -theophylline -warfarin This list may not describe all possible interactions. Give your health care provider a list of all the  medicines, herbs, non-prescription drugs, or dietary supplements you use. Also tell them if you smoke, drink alcohol, or use  illegal drugs. Some items may interact with your medicine. What should I watch for while using this medicine? Be sure to take this medicine with plenty of fluids. Some tablets may cause choking, gagging, or difficulty swallowing from the tablet getting stuck in your throat. Most of these problems disappear if the medicine is taken with the right amount of water or other fluids. Do not switch brands of this medicine unless your health care professional agrees with the change. Ask questions if you are uncertain. You will need regular exams and occasional blood tests to check the response to treatment. If you are receiving this medicine for an underactive thyroid, it may be several weeks before you notice an improvement. Check with your doctor or health care professional if your symptoms do not improve. It may be necessary for you to take this medicine for the rest of your life. Do not stop using this medicine unless your doctor or health care professional advises you to. This medicine can affect blood sugar levels. If you have diabetes, check your blood sugar as directed. You may lose some of your hair when you first start treatment. With time, this usually corrects itself. If you are going to have surgery, tell your doctor or health care professional that you are taking this medicine. What side effects may I notice from receiving this medicine? Side effects that you should report to your doctor or health care professional as soon as possible: -allergic reactions like skin rash, itching or hives, swelling of the face, lips, or tongue -chest pain -excessive sweating or intolerance to heat -fast or irregular heartbeat -nervousness -skin rash or hives -swelling of ankles, feet, or legs -tremors Side effects that usually do not require medical attention (report to your doctor or health care professional if they continue or are bothersome): -changes in appetite -changes in menstrual  periods -diarrhea -hair loss -headache -trouble sleeping -weight loss This list may not describe all possible side effects. Call your doctor for medical advice about side effects. You may report side effects to FDA at 1-800-FDA-1088. Where should I keep my medicine? Keep out of the reach of children. Store at room temperature between 15 and 30 degrees C (59 and 86 degrees F). Protect from light and moisture. Keep container tightly closed. Throw away any unused medicine after the expiration date. NOTE: This sheet is a summary. It may not cover all possible information. If you have questions about this medicine, talk to your doctor, pharmacist, or health care provider.  2018 Elsevier/Gold Standard (2008-12-06 14:28:07)

## 2017-04-26 NOTE — Progress Notes (Signed)
Subjective:    Patient ID: Gentri Guardado, female    DOB: 06/17/1956, 61 y.o.   MRN: [id number] Chief Complaint  Patient presents with  . Medication Management    moving to [place]   . Follow-up    HPI  Neena is a delightful 61 yo woman here today for a f/u on her chronic medical conditions.  She is moving to [place], [place] - her wife Eustaquio Maize got a new job and has already moved. Their house sold in 3d and is scheduled for 6 wks in closure and Cuma will move out after it closes.   Hypothyroidism: on levothyroxine 137 mcg. Last dose adjustment was a decrease from 150 10/2015. Notices dry skin but no other complaints.  Is Biotin WITH the levothyroxine but will change to take this alone. Lab Results  Component Value Date   TSH 2.680 09/01/2016   TSH 2.11 11/05/2015   TSH 0.996 03/19/2015   HTN:  Occ checks outside of office (1x/mo) and usu 120s/80s but not in past few mos - to busy. On atenolol 25mg  qd and hctz 25mg  qd.  Had sedated oral surgery from dentist last wk - 3 teeth - on antibiotic azithro with severe diarrhea but now starting to recover eating yogurt and high fiber.   HLD: Lipids 7 mo prior with non-HDL 230. ASCVD risk calculator gives 10 yr risk of 6.4% with optimal risk 2.3%. Risk could be lowered to 4.8% with starting statin.  Brother with heart disease? Also in maternal grandparents.  Prior has informed me that she would be unwilling to go onto lipid-lowering medication though did take omega-3 supplements some. Is fasting today other than coffee with cream. Unable to get any EKG now or prior as pt allergic to adhesive on leads - does not want them on her for even a minute as states reaction is immediate. Lab Results  Component Value Date   LDLCALC 173 (H) 09/01/2016   LDLCALC 171 (H) 08/17/2013    H/o vit B12 def: takes SL B12 supp qd but still gets sx/energy benefit from occ B12 injections (was on monthly replacement many years prior per pt) Lab Results  Component Value Date     VITAMINB12 950 09/01/2016   Morbid obesity and Elev glucose on last FASTING labs. No prior a1c.  Is pescatarian and tries to eat healthy - wants to go vegan.  Does go to whole wheat pasta, brown rice, whole wheat bread. Walks a little for exercise. Father with DM. Lab Results  Component Value Date   GLUCOSE 116 (H) 09/01/2016   GLUCOSE 111 (H) 11/05/2015   GLUCOSE 99 03/19/2015   Migraines: rare, prn maxalt (did fine on imitrex prior as well). Last few mos have had a few with all of the stress. Insomnia: benadryl 25mg  qhs prn very rarely as makes her drowsy the next day.  Sleeping well GERD: trx'd w/ carbonated drink qhs prn but controlled if she avoid spice.    Past Medical History:  Diagnosis Date  . Depression   . History of cardiac murmur in childhood    resolved   . Hypertension   . Thyroid disease    Past Surgical History:  Procedure Laterality Date  . LUMBAR EPIDURAL INJECTION     Current Outpatient Prescriptions on File Prior to Visit  Medication Sig Dispense Refill  . cholecalciferol (VITAMIN D) 1000 UNITS tablet Take 1,000 Units by mouth 3 (three) times a week.    . levothyroxine (SYNTHROID, LEVOTHROID) 137 MCG tablet  TAKE ONE TABLET BY MOUTH ONCE DAILY BEFORE BREAKFAST 90 tablet 3  . rizatriptan (MAXALT) 5 MG tablet Take 1 tablet (5 mg total) by mouth as needed for migraine. May repeat in 2 hours if needed 10 tablet 1   No current facility-administered medications on file prior to visit.    Allergies  Allergen Reactions  . Shellfish Allergy Anaphylaxis and Rash  . Amoxicillin   . Codeine   . Penicillins    Family History  Problem Relation Age of Onset  . Alzheimer's disease Mother   . Diabetes Father   . Arthritis Father   . Multiple sclerosis Sister   . Heart disease Brother   . Diabetes Maternal Grandmother   . Heart disease Maternal Grandmother   . Heart disease Maternal Grandfather   . Breast cancer Neg Hx    Social History   Social History  .  Marital status: Significant Other    Spouse name: N/A  . Number of children: N/A  . Years of education: N/A   Social History Main Topics  . Smoking status: Former Research scientist (life sciences)  . Smokeless tobacco: Never Used  . Alcohol use 0.0 oz/week    3 - 4 Glasses of wine per week  . Drug use: No  . Sexual activity: Yes    Partners: Female    Birth control/ protection: Post-menopausal   Other Topics Concern  . None   Social History Narrative  . None   Depression screen [facility] 2/9 04/26/2017 07/28/2016 03/19/2015 05/31/2014 01/11/2014  Decreased Interest 0 0 0 0 0  Down, Depressed, Hopeless 0 1 0 0 1  PHQ - 2 Score 0 1 0 0 1     Review of Systems See hpi    Objective:   Physical Exam  Constitutional: She is oriented to person, place, and time. She appears well-developed and well-nourished. No distress.  HENT:  Head: Normocephalic and atraumatic.  Right Ear: External ear normal.  Left Ear: External ear normal.  Eyes: Conjunctivae are normal. No scleral icterus.  Neck: Normal range of motion. Neck supple. No thyromegaly present.  Cardiovascular: Normal rate, regular rhythm and intact distal pulses.   Murmur heard.  Systolic murmur is present with a grade of 2/6  2/6 systolic murmur right upper sternal border.   Pulmonary/Chest: Effort normal and breath sounds normal. No respiratory distress.  Musculoskeletal: She exhibits no edema.  Lymphadenopathy:    She has no cervical adenopathy.  Neurological: She is alert and oriented to person, place, and time.  Skin: Skin is warm and dry. She is not diaphoretic. No erythema.  Psychiatric: She has a normal mood and affect. Her behavior is normal.      BP 122/70   Pulse (!) 56   Temp 97.8 F (36.6 C) (Oral)   Resp 18   Ht 5\' 5"  (1.651 m)   Wt 238 lb (108 kg)   SpO2 97%   BMI 39.61 kg/m   Assessment & Plan:  Tsh, a1c, cmp Not check lipids since won't consider statin.  1. Hypothyroidism, unspecified type   2. Essential hypertension, benign    3. Hyperlipidemia, unspecified hyperlipidemia type   4. Vitamin B12 deficiency   5. Medication monitoring encounter   6. Class 3 severe obesity due to excess calories without serious comorbidity with body mass index (BMI) of 40.0 to 44.9 in adult (HCC)   7. Elevated fasting glucose   8. Urinary symptom or sign     Orders Placed This Encounter  Procedures  .  Urine Culture  . TSH  . Comprehensive metabolic panel    Order Specific Question:   Has the patient fasted?    Answer:   Yes  . Hemoglobin A1c  . POCT urinalysis dipstick    Meds ordered this encounter  Medications  . atenolol (TENORMIN) 50 MG tablet    Sig: Take 0.5 tablets (25 mg total) by mouth daily.    Dispense:  45 tablet    Refill:  3  . hydrochlorothiazide (HYDRODIURIL) 25 MG tablet    Sig: Take 1 tablet (25 mg total) by mouth daily.    Dispense:  90 tablet    Refill:  1     [name], M.D.  Primary Care at [facility] [address] [place], [place] [address] [phone] phone [phone] fax  04/28/17 11:23 PM

## 2017-04-27 LAB — TSH: TSH: 2.63 u[IU]/mL (ref 0.450–4.500)

## 2017-04-27 LAB — COMPREHENSIVE METABOLIC PANEL
A/G RATIO: 2 (ref 1.2–2.2)
ALK PHOS: 65 IU/L (ref 39–117)
ALT: 14 IU/L (ref 0–32)
AST: 17 IU/L (ref 0–40)
Albumin: 4.5 g/dL (ref 3.6–4.8)
BUN/Creatinine Ratio: 20 (ref 12–28)
BUN: 16 mg/dL (ref 8–27)
Bilirubin Total: 0.3 mg/dL (ref 0.0–1.2)
CALCIUM: 10.3 mg/dL (ref 8.7–10.3)
CO2: 26 mmol/L (ref 20–29)
Chloride: 101 mmol/L (ref 96–106)
Creatinine, Ser: 0.81 mg/dL (ref 0.57–1.00)
GFR calc Af Amer: 90 mL/min/{1.73_m2} (ref >59)
GFR, EST NON AFRICAN AMERICAN: 78 mL/min/{1.73_m2} (ref >59)
Globulin, Total: 2.2 g/dL (ref 1.5–4.5)
Glucose: 101 mg/dL — ABNORMAL HIGH (ref 65–99)
POTASSIUM: 4.5 mmol/L (ref 3.5–5.2)
SODIUM: 140 mmol/L (ref 134–144)
Total Protein: 6.7 g/dL (ref 6.0–8.5)

## 2017-04-27 LAB — HEMOGLOBIN A1C
ESTIMATED AVERAGE GLUCOSE: 126 mg/dL
HEMOGLOBIN A1C: 6 % — AB (ref 4.8–5.6)

## 2017-04-27 LAB — URINE CULTURE

## 2017-04-28 MED ORDER — HYDROCHLOROTHIAZIDE 25 MG PO TABS: 25.0000 mg | ORAL_TABLET | Freq: Every day | ORAL | 3 refills | Status: AC

## 2017-06-17 ENCOUNTER — Other Ambulatory Visit: Payer: Self-pay | Admitting: Family Medicine

## 2017-06-20 NOTE — Telephone Encounter (Signed)
Dr. Brigitte Pulse,  Is it ok to fill, I don't see any mention in her visit on in August

## 2017-08-15 ENCOUNTER — Other Ambulatory Visit: Payer: Self-pay | Admitting: Family Medicine

## 2017-08-15 ENCOUNTER — Encounter: Payer: Self-pay | Admitting: Family Medicine
